# Patient Record
Sex: Female | Born: 1970 | Race: White | Hispanic: No | Marital: Married | Smoking: Never smoker
Health system: Southern US, Community
[De-identification: ages and names within clinical notes are randomized; demographics above are authoritative.]

## PROBLEM LIST (undated history)

## (undated) DIAGNOSIS — B019 Varicella without complication: Secondary | ICD-10-CM

## (undated) DIAGNOSIS — F32A Depression, unspecified: Secondary | ICD-10-CM

## (undated) DIAGNOSIS — J90 Pleural effusion, not elsewhere classified: Secondary | ICD-10-CM

## (undated) DIAGNOSIS — C50919 Malignant neoplasm of unspecified site of unspecified female breast: Secondary | ICD-10-CM

## (undated) DIAGNOSIS — G43909 Migraine, unspecified, not intractable, without status migrainosus: Secondary | ICD-10-CM

## (undated) DIAGNOSIS — E041 Nontoxic single thyroid nodule: Secondary | ICD-10-CM

## (undated) DIAGNOSIS — I509 Heart failure, unspecified: Secondary | ICD-10-CM

## (undated) DIAGNOSIS — F329 Major depressive disorder, single episode, unspecified: Secondary | ICD-10-CM

## (undated) HISTORY — DX: Varicella without complication: B01.9

## (undated) HISTORY — PX: WISDOM TOOTH EXTRACTION: SHX21

## (undated) HISTORY — DX: Malignant neoplasm of unspecified site of unspecified female breast: C50.919

## (undated) HISTORY — DX: Migraine, unspecified, not intractable, without status migrainosus: G43.909

## (undated) HISTORY — DX: Depression, unspecified: F32.A

## (undated) HISTORY — PX: TONSILLECTOMY AND ADENOIDECTOMY: SHX28

## (undated) HISTORY — PX: BUNIONECTOMY: SHX129

---
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
## 2015-10-22 DIAGNOSIS — I509 Heart failure, unspecified: Secondary | ICD-10-CM

## 2015-10-22 HISTORY — PX: BREAST BIOPSY: SHX20

## 2015-10-22 HISTORY — DX: Heart failure, unspecified: I50.9

## 2016-03-28 DIAGNOSIS — Z170 Estrogen receptor positive status [ER+]: Secondary | ICD-10-CM | POA: Insufficient documentation

## 2016-03-28 DIAGNOSIS — C50411 Malignant neoplasm of upper-outer quadrant of right female breast: Secondary | ICD-10-CM | POA: Insufficient documentation

## 2016-05-29 DIAGNOSIS — E041 Nontoxic single thyroid nodule: Secondary | ICD-10-CM | POA: Insufficient documentation

## 2016-05-29 HISTORY — DX: Nontoxic single thyroid nodule: E04.1

## 2016-07-02 DIAGNOSIS — T451X5A Adverse effect of antineoplastic and immunosuppressive drugs, initial encounter: Secondary | ICD-10-CM | POA: Insufficient documentation

## 2016-07-02 DIAGNOSIS — D701 Agranulocytosis secondary to cancer chemotherapy: Secondary | ICD-10-CM | POA: Insufficient documentation

## 2017-01-19 LAB — HM DEXA SCAN

## 2017-02-05 DIAGNOSIS — J90 Pleural effusion, not elsewhere classified: Secondary | ICD-10-CM

## 2017-02-05 DIAGNOSIS — I509 Heart failure, unspecified: Secondary | ICD-10-CM | POA: Insufficient documentation

## 2017-02-05 DIAGNOSIS — I5021 Acute systolic (congestive) heart failure: Secondary | ICD-10-CM | POA: Insufficient documentation

## 2017-02-05 HISTORY — DX: Pleural effusion, not elsewhere classified: J90

## 2017-06-24 DIAGNOSIS — Z170 Estrogen receptor positive status [ER+]: Secondary | ICD-10-CM | POA: Diagnosis not present

## 2017-06-24 DIAGNOSIS — C50411 Malignant neoplasm of upper-outer quadrant of right female breast: Secondary | ICD-10-CM | POA: Diagnosis not present

## 2017-06-24 DIAGNOSIS — Z5111 Encounter for antineoplastic chemotherapy: Secondary | ICD-10-CM | POA: Diagnosis not present

## 2017-07-07 DIAGNOSIS — M25511 Pain in right shoulder: Secondary | ICD-10-CM | POA: Diagnosis not present

## 2017-07-07 DIAGNOSIS — I509 Heart failure, unspecified: Secondary | ICD-10-CM | POA: Diagnosis not present

## 2017-07-07 DIAGNOSIS — Z6825 Body mass index (BMI) 25.0-25.9, adult: Secondary | ICD-10-CM | POA: Diagnosis not present

## 2017-07-07 DIAGNOSIS — I428 Other cardiomyopathies: Secondary | ICD-10-CM | POA: Diagnosis not present

## 2017-07-14 DIAGNOSIS — M7541 Impingement syndrome of right shoulder: Secondary | ICD-10-CM | POA: Diagnosis not present

## 2017-07-22 DIAGNOSIS — Z5112 Encounter for antineoplastic immunotherapy: Secondary | ICD-10-CM | POA: Diagnosis not present

## 2017-07-22 DIAGNOSIS — Z170 Estrogen receptor positive status [ER+]: Secondary | ICD-10-CM | POA: Diagnosis not present

## 2017-07-22 DIAGNOSIS — C50411 Malignant neoplasm of upper-outer quadrant of right female breast: Secondary | ICD-10-CM | POA: Diagnosis not present

## 2017-07-28 DIAGNOSIS — M75101 Unspecified rotator cuff tear or rupture of right shoulder, not specified as traumatic: Secondary | ICD-10-CM | POA: Diagnosis not present

## 2017-07-28 DIAGNOSIS — M7581 Other shoulder lesions, right shoulder: Secondary | ICD-10-CM | POA: Diagnosis not present

## 2017-07-28 DIAGNOSIS — M75111 Incomplete rotator cuff tear or rupture of right shoulder, not specified as traumatic: Secondary | ICD-10-CM | POA: Diagnosis not present

## 2017-07-30 DIAGNOSIS — S4381XA Sprain of other specified parts of right shoulder girdle, initial encounter: Secondary | ICD-10-CM | POA: Diagnosis not present

## 2017-07-30 DIAGNOSIS — Z01818 Encounter for other preprocedural examination: Secondary | ICD-10-CM | POA: Diagnosis not present

## 2017-08-04 DIAGNOSIS — C50911 Malignant neoplasm of unspecified site of right female breast: Secondary | ICD-10-CM | POA: Diagnosis not present

## 2017-08-04 DIAGNOSIS — Z923 Personal history of irradiation: Secondary | ICD-10-CM | POA: Diagnosis not present

## 2017-08-04 DIAGNOSIS — Z421 Encounter for breast reconstruction following mastectomy: Secondary | ICD-10-CM | POA: Diagnosis not present

## 2017-08-04 DIAGNOSIS — I5021 Acute systolic (congestive) heart failure: Secondary | ICD-10-CM | POA: Diagnosis not present

## 2017-08-04 DIAGNOSIS — Z9013 Acquired absence of bilateral breasts and nipples: Secondary | ICD-10-CM | POA: Diagnosis not present

## 2017-08-04 DIAGNOSIS — Z87891 Personal history of nicotine dependence: Secondary | ICD-10-CM | POA: Diagnosis not present

## 2017-08-05 DIAGNOSIS — I5021 Acute systolic (congestive) heart failure: Secondary | ICD-10-CM | POA: Diagnosis not present

## 2017-08-05 DIAGNOSIS — Z87891 Personal history of nicotine dependence: Secondary | ICD-10-CM | POA: Diagnosis not present

## 2017-08-05 DIAGNOSIS — Z923 Personal history of irradiation: Secondary | ICD-10-CM | POA: Diagnosis not present

## 2017-08-05 DIAGNOSIS — Z9013 Acquired absence of bilateral breasts and nipples: Secondary | ICD-10-CM | POA: Diagnosis not present

## 2017-08-05 DIAGNOSIS — Z421 Encounter for breast reconstruction following mastectomy: Secondary | ICD-10-CM | POA: Diagnosis not present

## 2017-08-05 DIAGNOSIS — C50911 Malignant neoplasm of unspecified site of right female breast: Secondary | ICD-10-CM | POA: Diagnosis not present

## 2017-08-19 DIAGNOSIS — Z5111 Encounter for antineoplastic chemotherapy: Secondary | ICD-10-CM | POA: Diagnosis not present

## 2017-08-19 DIAGNOSIS — Z170 Estrogen receptor positive status [ER+]: Secondary | ICD-10-CM | POA: Diagnosis not present

## 2017-08-19 DIAGNOSIS — C50411 Malignant neoplasm of upper-outer quadrant of right female breast: Secondary | ICD-10-CM | POA: Diagnosis not present

## 2017-08-19 DIAGNOSIS — Z6825 Body mass index (BMI) 25.0-25.9, adult: Secondary | ICD-10-CM | POA: Diagnosis not present

## 2017-08-23 DIAGNOSIS — Z87891 Personal history of nicotine dependence: Secondary | ICD-10-CM | POA: Diagnosis not present

## 2017-08-23 DIAGNOSIS — Z853 Personal history of malignant neoplasm of breast: Secondary | ICD-10-CM | POA: Diagnosis not present

## 2017-08-23 DIAGNOSIS — N61 Mastitis without abscess: Secondary | ICD-10-CM | POA: Diagnosis not present

## 2017-08-23 DIAGNOSIS — R918 Other nonspecific abnormal finding of lung field: Secondary | ICD-10-CM | POA: Diagnosis not present

## 2017-08-23 DIAGNOSIS — Z800 Family history of malignant neoplasm of digestive organs: Secondary | ICD-10-CM | POA: Diagnosis not present

## 2017-08-23 DIAGNOSIS — T8579XA Infection and inflammatory reaction due to other internal prosthetic devices, implants and grafts, initial encounter: Secondary | ICD-10-CM | POA: Diagnosis not present

## 2017-08-23 DIAGNOSIS — C50411 Malignant neoplasm of upper-outer quadrant of right female breast: Secondary | ICD-10-CM | POA: Diagnosis not present

## 2017-08-23 DIAGNOSIS — Z803 Family history of malignant neoplasm of breast: Secondary | ICD-10-CM | POA: Diagnosis not present

## 2017-08-23 DIAGNOSIS — R509 Fever, unspecified: Secondary | ICD-10-CM | POA: Diagnosis not present

## 2017-08-23 DIAGNOSIS — T451X5A Adverse effect of antineoplastic and immunosuppressive drugs, initial encounter: Secondary | ICD-10-CM | POA: Diagnosis not present

## 2017-08-23 DIAGNOSIS — T8140XA Infection following a procedure, unspecified, initial encounter: Secondary | ICD-10-CM | POA: Diagnosis not present

## 2017-08-23 DIAGNOSIS — I5021 Acute systolic (congestive) heart failure: Secondary | ICD-10-CM | POA: Diagnosis not present

## 2017-08-23 DIAGNOSIS — Z170 Estrogen receptor positive status [ER+]: Secondary | ICD-10-CM | POA: Diagnosis not present

## 2017-08-23 DIAGNOSIS — Z8041 Family history of malignant neoplasm of ovary: Secondary | ICD-10-CM | POA: Diagnosis not present

## 2017-09-18 DIAGNOSIS — C50411 Malignant neoplasm of upper-outer quadrant of right female breast: Secondary | ICD-10-CM | POA: Diagnosis not present

## 2017-09-18 DIAGNOSIS — Z170 Estrogen receptor positive status [ER+]: Secondary | ICD-10-CM | POA: Diagnosis not present

## 2017-09-18 DIAGNOSIS — Z5111 Encounter for antineoplastic chemotherapy: Secondary | ICD-10-CM | POA: Diagnosis not present

## 2017-10-20 DIAGNOSIS — Z170 Estrogen receptor positive status [ER+]: Secondary | ICD-10-CM | POA: Diagnosis not present

## 2017-10-20 DIAGNOSIS — C50411 Malignant neoplasm of upper-outer quadrant of right female breast: Secondary | ICD-10-CM | POA: Diagnosis not present

## 2017-10-21 HISTORY — PX: COLONOSCOPY: SHX174

## 2017-11-07 DIAGNOSIS — Z01818 Encounter for other preprocedural examination: Secondary | ICD-10-CM | POA: Diagnosis not present

## 2017-11-24 DIAGNOSIS — C50411 Malignant neoplasm of upper-outer quadrant of right female breast: Secondary | ICD-10-CM | POA: Diagnosis not present

## 2017-11-24 DIAGNOSIS — Z87891 Personal history of nicotine dependence: Secondary | ICD-10-CM | POA: Diagnosis not present

## 2017-11-24 DIAGNOSIS — I509 Heart failure, unspecified: Secondary | ICD-10-CM | POA: Diagnosis not present

## 2017-11-24 DIAGNOSIS — C50911 Malignant neoplasm of unspecified site of right female breast: Secondary | ICD-10-CM | POA: Diagnosis not present

## 2017-11-24 DIAGNOSIS — Z9011 Acquired absence of right breast and nipple: Secondary | ICD-10-CM | POA: Diagnosis not present

## 2017-11-26 DIAGNOSIS — I313 Pericardial effusion (noninflammatory): Secondary | ICD-10-CM | POA: Diagnosis not present

## 2017-11-26 DIAGNOSIS — Z79899 Other long term (current) drug therapy: Secondary | ICD-10-CM | POA: Diagnosis not present

## 2017-11-26 DIAGNOSIS — N644 Mastodynia: Secondary | ICD-10-CM | POA: Diagnosis not present

## 2017-11-26 DIAGNOSIS — Z6825 Body mass index (BMI) 25.0-25.9, adult: Secondary | ICD-10-CM | POA: Diagnosis not present

## 2017-11-26 DIAGNOSIS — C50411 Malignant neoplasm of upper-outer quadrant of right female breast: Secondary | ICD-10-CM | POA: Diagnosis not present

## 2017-11-26 DIAGNOSIS — Z170 Estrogen receptor positive status [ER+]: Secondary | ICD-10-CM | POA: Diagnosis not present

## 2017-11-26 DIAGNOSIS — F329 Major depressive disorder, single episode, unspecified: Secondary | ICD-10-CM | POA: Diagnosis not present

## 2017-12-03 DIAGNOSIS — Z170 Estrogen receptor positive status [ER+]: Secondary | ICD-10-CM | POA: Diagnosis not present

## 2017-12-03 DIAGNOSIS — Z6824 Body mass index (BMI) 24.0-24.9, adult: Secondary | ICD-10-CM | POA: Diagnosis not present

## 2017-12-03 DIAGNOSIS — C50411 Malignant neoplasm of upper-outer quadrant of right female breast: Secondary | ICD-10-CM | POA: Diagnosis not present

## 2017-12-10 DIAGNOSIS — S4381XD Sprain of other specified parts of right shoulder girdle, subsequent encounter: Secondary | ICD-10-CM | POA: Diagnosis not present

## 2017-12-24 DIAGNOSIS — Z170 Estrogen receptor positive status [ER+]: Secondary | ICD-10-CM | POA: Diagnosis not present

## 2017-12-24 DIAGNOSIS — Z5112 Encounter for antineoplastic immunotherapy: Secondary | ICD-10-CM | POA: Diagnosis not present

## 2017-12-24 DIAGNOSIS — C50411 Malignant neoplasm of upper-outer quadrant of right female breast: Secondary | ICD-10-CM | POA: Diagnosis not present

## 2018-01-02 DIAGNOSIS — I5022 Chronic systolic (congestive) heart failure: Secondary | ICD-10-CM | POA: Diagnosis not present

## 2018-01-02 DIAGNOSIS — Z6825 Body mass index (BMI) 25.0-25.9, adult: Secondary | ICD-10-CM | POA: Diagnosis not present

## 2018-01-02 DIAGNOSIS — I428 Other cardiomyopathies: Secondary | ICD-10-CM | POA: Diagnosis not present

## 2018-01-02 DIAGNOSIS — I509 Heart failure, unspecified: Secondary | ICD-10-CM | POA: Diagnosis not present

## 2018-01-09 DIAGNOSIS — I5022 Chronic systolic (congestive) heart failure: Secondary | ICD-10-CM | POA: Diagnosis not present

## 2018-01-09 DIAGNOSIS — I428 Other cardiomyopathies: Secondary | ICD-10-CM | POA: Diagnosis not present

## 2018-01-21 DIAGNOSIS — Z5112 Encounter for antineoplastic immunotherapy: Secondary | ICD-10-CM | POA: Diagnosis not present

## 2018-01-21 DIAGNOSIS — C50411 Malignant neoplasm of upper-outer quadrant of right female breast: Secondary | ICD-10-CM | POA: Diagnosis not present

## 2018-01-21 DIAGNOSIS — R1111 Vomiting without nausea: Secondary | ICD-10-CM | POA: Diagnosis not present

## 2018-01-21 DIAGNOSIS — Z170 Estrogen receptor positive status [ER+]: Secondary | ICD-10-CM | POA: Diagnosis not present

## 2018-01-21 DIAGNOSIS — R110 Nausea: Secondary | ICD-10-CM | POA: Diagnosis not present

## 2018-01-21 DIAGNOSIS — R51 Headache: Secondary | ICD-10-CM | POA: Diagnosis not present

## 2018-01-21 DIAGNOSIS — G44201 Tension-type headache, unspecified, intractable: Secondary | ICD-10-CM | POA: Diagnosis not present

## 2018-01-26 DIAGNOSIS — Z23 Encounter for immunization: Secondary | ICD-10-CM | POA: Diagnosis not present

## 2018-01-26 DIAGNOSIS — Z131 Encounter for screening for diabetes mellitus: Secondary | ICD-10-CM | POA: Diagnosis not present

## 2018-01-26 DIAGNOSIS — Z130 Encounter for screening for diseases of the blood and blood-forming organs and certain disorders involving the immune mechanism: Secondary | ICD-10-CM | POA: Diagnosis not present

## 2018-01-26 DIAGNOSIS — G43009 Migraine without aura, not intractable, without status migrainosus: Secondary | ICD-10-CM | POA: Diagnosis not present

## 2018-01-26 DIAGNOSIS — Z0000 Encounter for general adult medical examination without abnormal findings: Secondary | ICD-10-CM | POA: Diagnosis not present

## 2018-01-26 DIAGNOSIS — I509 Heart failure, unspecified: Secondary | ICD-10-CM | POA: Diagnosis not present

## 2018-01-26 DIAGNOSIS — Z13220 Encounter for screening for lipoid disorders: Secondary | ICD-10-CM | POA: Diagnosis not present

## 2018-01-26 DIAGNOSIS — Z1329 Encounter for screening for other suspected endocrine disorder: Secondary | ICD-10-CM | POA: Diagnosis not present

## 2018-02-09 DIAGNOSIS — I5022 Chronic systolic (congestive) heart failure: Secondary | ICD-10-CM | POA: Diagnosis not present

## 2018-02-09 DIAGNOSIS — I509 Heart failure, unspecified: Secondary | ICD-10-CM | POA: Diagnosis not present

## 2018-02-09 DIAGNOSIS — I428 Other cardiomyopathies: Secondary | ICD-10-CM | POA: Diagnosis not present

## 2018-02-09 DIAGNOSIS — Z6825 Body mass index (BMI) 25.0-25.9, adult: Secondary | ICD-10-CM | POA: Diagnosis not present

## 2018-02-20 DIAGNOSIS — Z170 Estrogen receptor positive status [ER+]: Secondary | ICD-10-CM | POA: Diagnosis not present

## 2018-02-20 DIAGNOSIS — C50411 Malignant neoplasm of upper-outer quadrant of right female breast: Secondary | ICD-10-CM | POA: Diagnosis not present

## 2018-03-27 DIAGNOSIS — I427 Cardiomyopathy due to drug and external agent: Secondary | ICD-10-CM | POA: Diagnosis not present

## 2018-03-27 DIAGNOSIS — Z170 Estrogen receptor positive status [ER+]: Secondary | ICD-10-CM | POA: Diagnosis not present

## 2018-03-27 DIAGNOSIS — Z5111 Encounter for antineoplastic chemotherapy: Secondary | ICD-10-CM | POA: Diagnosis not present

## 2018-03-27 DIAGNOSIS — C50411 Malignant neoplasm of upper-outer quadrant of right female breast: Secondary | ICD-10-CM | POA: Diagnosis not present

## 2018-03-27 DIAGNOSIS — E2839 Other primary ovarian failure: Secondary | ICD-10-CM | POA: Diagnosis not present

## 2018-04-27 DIAGNOSIS — Z9013 Acquired absence of bilateral breasts and nipples: Secondary | ICD-10-CM | POA: Diagnosis not present

## 2018-04-27 DIAGNOSIS — I5021 Acute systolic (congestive) heart failure: Secondary | ICD-10-CM | POA: Diagnosis not present

## 2018-04-27 DIAGNOSIS — R51 Headache: Secondary | ICD-10-CM | POA: Diagnosis not present

## 2018-04-27 DIAGNOSIS — Z9221 Personal history of antineoplastic chemotherapy: Secondary | ICD-10-CM | POA: Diagnosis not present

## 2018-04-27 DIAGNOSIS — Z421 Encounter for breast reconstruction following mastectomy: Secondary | ICD-10-CM | POA: Diagnosis not present

## 2018-04-27 DIAGNOSIS — I5022 Chronic systolic (congestive) heart failure: Secondary | ICD-10-CM | POA: Diagnosis not present

## 2018-04-27 DIAGNOSIS — C50411 Malignant neoplasm of upper-outer quadrant of right female breast: Secondary | ICD-10-CM | POA: Diagnosis not present

## 2018-04-27 DIAGNOSIS — Z923 Personal history of irradiation: Secondary | ICD-10-CM | POA: Diagnosis not present

## 2018-04-27 DIAGNOSIS — I428 Other cardiomyopathies: Secondary | ICD-10-CM | POA: Diagnosis not present

## 2018-04-27 DIAGNOSIS — Z87891 Personal history of nicotine dependence: Secondary | ICD-10-CM | POA: Diagnosis not present

## 2018-05-27 DIAGNOSIS — C50411 Malignant neoplasm of upper-outer quadrant of right female breast: Secondary | ICD-10-CM | POA: Diagnosis not present

## 2018-05-27 DIAGNOSIS — E782 Mixed hyperlipidemia: Secondary | ICD-10-CM | POA: Diagnosis not present

## 2018-05-27 DIAGNOSIS — Z6826 Body mass index (BMI) 26.0-26.9, adult: Secondary | ICD-10-CM | POA: Diagnosis not present

## 2018-05-27 DIAGNOSIS — K921 Melena: Secondary | ICD-10-CM | POA: Diagnosis not present

## 2018-05-27 DIAGNOSIS — R7309 Other abnormal glucose: Secondary | ICD-10-CM | POA: Diagnosis not present

## 2018-05-27 DIAGNOSIS — Z170 Estrogen receptor positive status [ER+]: Secondary | ICD-10-CM | POA: Diagnosis not present

## 2018-06-04 DIAGNOSIS — K921 Melena: Secondary | ICD-10-CM | POA: Diagnosis not present

## 2018-06-04 DIAGNOSIS — C50411 Malignant neoplasm of upper-outer quadrant of right female breast: Secondary | ICD-10-CM | POA: Diagnosis not present

## 2018-06-04 DIAGNOSIS — R194 Change in bowel habit: Secondary | ICD-10-CM | POA: Diagnosis not present

## 2018-06-04 DIAGNOSIS — R197 Diarrhea, unspecified: Secondary | ICD-10-CM | POA: Diagnosis not present

## 2018-06-05 DIAGNOSIS — K921 Melena: Secondary | ICD-10-CM | POA: Diagnosis not present

## 2018-06-09 DIAGNOSIS — K625 Hemorrhage of anus and rectum: Secondary | ICD-10-CM | POA: Diagnosis not present

## 2018-06-09 DIAGNOSIS — K648 Other hemorrhoids: Secondary | ICD-10-CM | POA: Diagnosis not present

## 2018-06-26 DIAGNOSIS — Z170 Estrogen receptor positive status [ER+]: Secondary | ICD-10-CM | POA: Diagnosis not present

## 2018-06-26 DIAGNOSIS — C50411 Malignant neoplasm of upper-outer quadrant of right female breast: Secondary | ICD-10-CM | POA: Diagnosis not present

## 2018-07-28 DIAGNOSIS — C50411 Malignant neoplasm of upper-outer quadrant of right female breast: Secondary | ICD-10-CM | POA: Diagnosis not present

## 2018-07-28 DIAGNOSIS — G5691 Unspecified mononeuropathy of right upper limb: Secondary | ICD-10-CM | POA: Diagnosis not present

## 2018-07-28 DIAGNOSIS — E2839 Other primary ovarian failure: Secondary | ICD-10-CM | POA: Diagnosis not present

## 2018-07-28 DIAGNOSIS — I427 Cardiomyopathy due to drug and external agent: Secondary | ICD-10-CM | POA: Diagnosis not present

## 2018-07-28 DIAGNOSIS — Z170 Estrogen receptor positive status [ER+]: Secondary | ICD-10-CM | POA: Diagnosis not present

## 2018-08-11 DIAGNOSIS — I509 Heart failure, unspecified: Secondary | ICD-10-CM | POA: Diagnosis not present

## 2018-08-11 DIAGNOSIS — Z6826 Body mass index (BMI) 26.0-26.9, adult: Secondary | ICD-10-CM | POA: Diagnosis not present

## 2018-08-11 DIAGNOSIS — I428 Other cardiomyopathies: Secondary | ICD-10-CM | POA: Diagnosis not present

## 2018-08-11 DIAGNOSIS — I5022 Chronic systolic (congestive) heart failure: Secondary | ICD-10-CM | POA: Diagnosis not present

## 2018-08-17 DIAGNOSIS — Z01818 Encounter for other preprocedural examination: Secondary | ICD-10-CM | POA: Diagnosis not present

## 2018-08-17 DIAGNOSIS — G5691 Unspecified mononeuropathy of right upper limb: Secondary | ICD-10-CM | POA: Diagnosis not present

## 2018-08-17 DIAGNOSIS — G5603 Carpal tunnel syndrome, bilateral upper limbs: Secondary | ICD-10-CM | POA: Diagnosis not present

## 2018-08-24 DIAGNOSIS — Z9013 Acquired absence of bilateral breasts and nipples: Secondary | ICD-10-CM | POA: Diagnosis not present

## 2018-08-24 DIAGNOSIS — N650 Deformity of reconstructed breast: Secondary | ICD-10-CM | POA: Diagnosis not present

## 2018-08-24 DIAGNOSIS — Z421 Encounter for breast reconstruction following mastectomy: Secondary | ICD-10-CM | POA: Diagnosis not present

## 2018-08-24 DIAGNOSIS — N6481 Ptosis of breast: Secondary | ICD-10-CM | POA: Diagnosis not present

## 2018-08-24 DIAGNOSIS — T66XXXA Radiation sickness, unspecified, initial encounter: Secondary | ICD-10-CM | POA: Diagnosis not present

## 2018-08-24 DIAGNOSIS — N6489 Other specified disorders of breast: Secondary | ICD-10-CM | POA: Diagnosis not present

## 2018-08-24 DIAGNOSIS — C50411 Malignant neoplasm of upper-outer quadrant of right female breast: Secondary | ICD-10-CM | POA: Diagnosis not present

## 2018-08-24 DIAGNOSIS — Z9221 Personal history of antineoplastic chemotherapy: Secondary | ICD-10-CM | POA: Diagnosis not present

## 2018-08-24 DIAGNOSIS — R079 Chest pain, unspecified: Secondary | ICD-10-CM | POA: Diagnosis not present

## 2018-08-24 DIAGNOSIS — Z923 Personal history of irradiation: Secondary | ICD-10-CM | POA: Diagnosis not present

## 2018-08-24 DIAGNOSIS — Z87891 Personal history of nicotine dependence: Secondary | ICD-10-CM | POA: Diagnosis not present

## 2018-08-24 DIAGNOSIS — Z853 Personal history of malignant neoplasm of breast: Secondary | ICD-10-CM | POA: Diagnosis not present

## 2018-08-28 DIAGNOSIS — Z170 Estrogen receptor positive status [ER+]: Secondary | ICD-10-CM | POA: Diagnosis not present

## 2018-08-28 DIAGNOSIS — Z5111 Encounter for antineoplastic chemotherapy: Secondary | ICD-10-CM | POA: Diagnosis not present

## 2018-08-28 DIAGNOSIS — C50411 Malignant neoplasm of upper-outer quadrant of right female breast: Secondary | ICD-10-CM | POA: Diagnosis not present

## 2018-09-28 DIAGNOSIS — Z170 Estrogen receptor positive status [ER+]: Secondary | ICD-10-CM | POA: Diagnosis not present

## 2018-09-28 DIAGNOSIS — C50411 Malignant neoplasm of upper-outer quadrant of right female breast: Secondary | ICD-10-CM | POA: Diagnosis not present

## 2018-10-07 DIAGNOSIS — G5603 Carpal tunnel syndrome, bilateral upper limbs: Secondary | ICD-10-CM | POA: Diagnosis not present

## 2018-10-07 DIAGNOSIS — G5613 Other lesions of median nerve, bilateral upper limbs: Secondary | ICD-10-CM | POA: Diagnosis not present

## 2018-10-07 DIAGNOSIS — R200 Anesthesia of skin: Secondary | ICD-10-CM | POA: Diagnosis not present

## 2018-10-22 DIAGNOSIS — G5603 Carpal tunnel syndrome, bilateral upper limbs: Secondary | ICD-10-CM | POA: Diagnosis not present

## 2018-10-28 DIAGNOSIS — Z170 Estrogen receptor positive status [ER+]: Secondary | ICD-10-CM | POA: Diagnosis not present

## 2018-10-28 DIAGNOSIS — Z5111 Encounter for antineoplastic chemotherapy: Secondary | ICD-10-CM | POA: Diagnosis not present

## 2018-10-28 DIAGNOSIS — C50411 Malignant neoplasm of upper-outer quadrant of right female breast: Secondary | ICD-10-CM | POA: Diagnosis not present

## 2018-11-05 DIAGNOSIS — R05 Cough: Secondary | ICD-10-CM | POA: Diagnosis not present

## 2018-11-05 DIAGNOSIS — Z6825 Body mass index (BMI) 25.0-25.9, adult: Secondary | ICD-10-CM | POA: Diagnosis not present

## 2018-11-05 DIAGNOSIS — J069 Acute upper respiratory infection, unspecified: Secondary | ICD-10-CM | POA: Diagnosis not present

## 2018-12-01 DIAGNOSIS — C50411 Malignant neoplasm of upper-outer quadrant of right female breast: Secondary | ICD-10-CM | POA: Diagnosis not present

## 2018-12-01 DIAGNOSIS — Z5111 Encounter for antineoplastic chemotherapy: Secondary | ICD-10-CM | POA: Diagnosis not present

## 2018-12-01 DIAGNOSIS — Z170 Estrogen receptor positive status [ER+]: Secondary | ICD-10-CM | POA: Diagnosis not present

## 2018-12-01 DIAGNOSIS — Z5181 Encounter for therapeutic drug level monitoring: Secondary | ICD-10-CM | POA: Diagnosis not present

## 2018-12-01 DIAGNOSIS — Z9189 Other specified personal risk factors, not elsewhere classified: Secondary | ICD-10-CM | POA: Diagnosis not present

## 2018-12-11 DIAGNOSIS — N390 Urinary tract infection, site not specified: Secondary | ICD-10-CM | POA: Diagnosis not present

## 2018-12-11 DIAGNOSIS — Z6826 Body mass index (BMI) 26.0-26.9, adult: Secondary | ICD-10-CM | POA: Diagnosis not present

## 2018-12-29 DIAGNOSIS — Z5111 Encounter for antineoplastic chemotherapy: Secondary | ICD-10-CM | POA: Diagnosis not present

## 2018-12-29 DIAGNOSIS — C50411 Malignant neoplasm of upper-outer quadrant of right female breast: Secondary | ICD-10-CM | POA: Diagnosis not present

## 2018-12-29 DIAGNOSIS — Z170 Estrogen receptor positive status [ER+]: Secondary | ICD-10-CM | POA: Diagnosis not present

## 2019-01-25 DIAGNOSIS — I5022 Chronic systolic (congestive) heart failure: Secondary | ICD-10-CM | POA: Diagnosis not present

## 2019-01-25 DIAGNOSIS — I428 Other cardiomyopathies: Secondary | ICD-10-CM | POA: Diagnosis not present

## 2019-01-26 DIAGNOSIS — Z5111 Encounter for antineoplastic chemotherapy: Secondary | ICD-10-CM | POA: Diagnosis not present

## 2019-01-26 DIAGNOSIS — Z170 Estrogen receptor positive status [ER+]: Secondary | ICD-10-CM | POA: Diagnosis not present

## 2019-01-26 DIAGNOSIS — C50411 Malignant neoplasm of upper-outer quadrant of right female breast: Secondary | ICD-10-CM | POA: Diagnosis not present

## 2019-02-23 DIAGNOSIS — Z170 Estrogen receptor positive status [ER+]: Secondary | ICD-10-CM | POA: Diagnosis not present

## 2019-02-23 DIAGNOSIS — Z5111 Encounter for antineoplastic chemotherapy: Secondary | ICD-10-CM | POA: Diagnosis not present

## 2019-02-23 DIAGNOSIS — C50411 Malignant neoplasm of upper-outer quadrant of right female breast: Secondary | ICD-10-CM | POA: Diagnosis not present

## 2019-03-24 DIAGNOSIS — Z9221 Personal history of antineoplastic chemotherapy: Secondary | ICD-10-CM | POA: Diagnosis not present

## 2019-03-24 DIAGNOSIS — Z923 Personal history of irradiation: Secondary | ICD-10-CM | POA: Diagnosis not present

## 2019-03-24 DIAGNOSIS — C50411 Malignant neoplasm of upper-outer quadrant of right female breast: Secondary | ICD-10-CM | POA: Diagnosis not present

## 2019-03-24 DIAGNOSIS — Z170 Estrogen receptor positive status [ER+]: Secondary | ICD-10-CM | POA: Diagnosis not present

## 2019-03-24 DIAGNOSIS — L0390 Cellulitis, unspecified: Secondary | ICD-10-CM | POA: Diagnosis not present

## 2019-03-24 DIAGNOSIS — I340 Nonrheumatic mitral (valve) insufficiency: Secondary | ICD-10-CM | POA: Diagnosis not present

## 2019-03-24 DIAGNOSIS — I509 Heart failure, unspecified: Secondary | ICD-10-CM | POA: Diagnosis not present

## 2019-03-24 DIAGNOSIS — Z9012 Acquired absence of left breast and nipple: Secondary | ICD-10-CM | POA: Diagnosis not present

## 2019-03-24 DIAGNOSIS — I313 Pericardial effusion (noninflammatory): Secondary | ICD-10-CM | POA: Diagnosis not present

## 2019-03-24 DIAGNOSIS — F329 Major depressive disorder, single episode, unspecified: Secondary | ICD-10-CM | POA: Diagnosis not present

## 2019-03-24 DIAGNOSIS — Z6825 Body mass index (BMI) 25.0-25.9, adult: Secondary | ICD-10-CM | POA: Diagnosis not present

## 2019-03-24 DIAGNOSIS — Z9289 Personal history of other medical treatment: Secondary | ICD-10-CM | POA: Diagnosis not present

## 2019-04-21 HISTORY — PX: MASTECTOMY: SHX3

## 2019-04-26 DIAGNOSIS — Z170 Estrogen receptor positive status [ER+]: Secondary | ICD-10-CM | POA: Diagnosis not present

## 2019-04-26 DIAGNOSIS — C50411 Malignant neoplasm of upper-outer quadrant of right female breast: Secondary | ICD-10-CM | POA: Diagnosis not present

## 2019-05-25 ENCOUNTER — Encounter: Payer: Self-pay | Admitting: Family Medicine

## 2019-05-25 ENCOUNTER — Other Ambulatory Visit: Payer: Self-pay

## 2019-05-25 ENCOUNTER — Ambulatory Visit (INDEPENDENT_AMBULATORY_CARE_PROVIDER_SITE_OTHER): Payer: BC Managed Care – PPO | Admitting: Family Medicine

## 2019-05-25 VITALS — BP 110/80 | HR 80 | Temp 97.7°F | Resp 17 | Ht 61.0 in | Wt 131.4 lb

## 2019-05-25 DIAGNOSIS — C50411 Malignant neoplasm of upper-outer quadrant of right female breast: Secondary | ICD-10-CM | POA: Diagnosis not present

## 2019-05-25 DIAGNOSIS — F339 Major depressive disorder, recurrent, unspecified: Secondary | ICD-10-CM | POA: Insufficient documentation

## 2019-05-25 DIAGNOSIS — Z7689 Persons encountering health services in other specified circumstances: Secondary | ICD-10-CM

## 2019-05-25 DIAGNOSIS — F4321 Adjustment disorder with depressed mood: Secondary | ICD-10-CM | POA: Insufficient documentation

## 2019-05-25 DIAGNOSIS — I5022 Chronic systolic (congestive) heart failure: Secondary | ICD-10-CM

## 2019-05-25 DIAGNOSIS — Z170 Estrogen receptor positive status [ER+]: Secondary | ICD-10-CM

## 2019-05-25 DIAGNOSIS — F330 Major depressive disorder, recurrent, mild: Secondary | ICD-10-CM | POA: Insufficient documentation

## 2019-05-25 DIAGNOSIS — I10 Essential (primary) hypertension: Secondary | ICD-10-CM | POA: Insufficient documentation

## 2019-05-25 MED ORDER — BUPROPION HCL ER (XL) 300 MG PO TB24: 300.0000 mg | ORAL_TABLET | Freq: Every day | ORAL | 1 refills | Status: DC

## 2019-05-25 MED ORDER — TRAZODONE HCL 50 MG PO TABS: ORAL_TABLET | ORAL | 0 refills | Status: DC

## 2019-05-25 NOTE — Patient Instructions (Addendum)
Pleasure to meet you today. If you guys need anything, please let us know. Start the trazodone before bed as we discussed Our office will call you to schedule a follow up in 4 weeks.     Please help [place] help you:  We are honored you have chosen [facility] for your Primary Care home. Below you will find basic instructions that you may need to access in the future. Please help [place] help you by reading the instructions, which cover many of the frequent questions we experience.   Prescription refills and request:  -In order to allow more efficient response time, please call your pharmacy for all refills. They will forward the request electronically to [place]. This allows for the quickest possible response. Request left on a nurse line can take longer to refill, since these are checked as time allows between office patients and other phone calls.  - refill request can take up to 3-5 working days to complete.  - If request is sent electronically and request is appropiate, it is usually completed in 1-2 business days.  - all patients will need to be seen routinely for all chronic medical conditions requiring prescription medications (see follow-up below). If you are overdue for follow up on your condition, you will be asked to make an appointment and we will call in enough medication to cover you until your appointment (up to 30 days).  - all controlled substances will require a face to face visit to request/refill.  - if you desire your prescriptions to go through a new pharmacy, and have an active script at original pharmacy, you will need to call your pharmacy and have scripts transferred to new pharmacy. This is completed between the pharmacy locations and not by your provider.    Results: If any images or labs were ordered, it can take up to 1 week to get results depending on the test ordered and the lab/facility running and resulting the test. - Normal or stable results, which do not need further  discussion, may be released to your mychart immediately with attached note to you. A call may not be generated for normal results. Please make certain to sign up for mychart. If you have questions on how to activate your mychart you can call the front office.  - If your results need further discussion, our office will attempt to contact you via phone, and if unable to reach you after 2 attempts, we will release your abnormal result to your mychart with instructions.  - All results will be automatically released in mychart after 1 week.  - Your provider will provide you with explanation and instruction on all relevant material in your results. Please keep in mind, results and labs may appear confusing or abnormal to the untrained eye, but it does not mean they are actually abnormal for you personally. If you have any questions about your results that are not covered, or you desire more detailed explanation than what was provided, you should make an appointment with your provider to do so.   Our office handles many outgoing and incoming calls daily. If we have not contacted you within 1 week about your results, please check your mychart to see if there is a message first and if not, then contact our office.  In helping with this matter, you help decrease call volume, and therefore allow [place] to be able to respond to patients needs more efficiently.   Acute office visits (sick visit):  An acute  visit is intended for a new problem and are scheduled in shorter time slots to allow schedule openings for patients with new problems. This is the appropriate visit to discuss a new problem. Problems will not be addressed by phone call or Echart message. Appointment is needed if requesting treatment. In order to provide you with excellent quality medical care with proper time for you to explain your problem, have an exam and receive treatment with instructions, these appointments should be limited to one new problem per  visit. If you experience a new problem, in which you desire to be addressed, please make an acute office visit, we save openings on the schedule to accommodate you. Please do not save your new problem for any other type of visit, let us take care of it properly and quickly for you.   Follow up visits:  Depending on your condition(s) your provider will need to see you routinely in order to provide you with quality care and prescribe medication(s). Most chronic conditions (Example: hypertension, Diabetes, depression/anxiety... etc), require visits a couple times a year. Your provider will instruct you on proper follow up for your personal medical conditions and history. Please make certain to make follow up appointments for your condition as instructed. Failing to do so could result in lapse in your medication treatment/refills. If you request a refill, and are overdue to be seen on a condition, we will always provide you with a 30 day script (once) to allow you time to schedule.    Medicare wellness (well visit): - we have a wonderful Nurse Maudie Mercury), that will meet with you and provide you will yearly medicare wellness visits. These visits should occur yearly (can not be scheduled less than 1 calendar year apart) and cover preventive health, immunizations, advance directives and screenings you are entitled to yearly through your medicare benefits. Do not miss out on your entitled benefits, this is when medicare will pay for these benefits to be ordered for you.  These are strongly encouraged by your provider and is the appropriate type of visit to make certain you are up to date with all preventive health benefits. If you have not had your medicare wellness exam in the last 12 months, please make certain to schedule one by calling the office and schedule your medicare wellness with Maudie Mercury as soon as possible.   Yearly physical (well visit):  - Adults are recommended to be seen yearly for physicals. Check with  your insurance and date of your last physical, most insurances require one calendar year between physicals. Physicals include all preventive health topics, screenings, medical exam and labs that are appropriate for gender/age and history. You may have fasting labs needed at this visit. This is a well visit (not a sick visit), new problems should not be covered during this visit (see acute visit).  - Pediatric patients are seen more frequently when they are younger. Your provider will advise you on well child visit timing that is appropriate for your their age. - This is not a medicare wellness visit. Medicare wellness exams do not have an exam portion to the visit. Some medicare companies allow for a physical, some do not allow a yearly physical. If your medicare allows a yearly physical you can schedule the medicare wellness with our nurse Maudie Mercury and have your physical with your provider after, on the same day. Please check with insurance for your full benefits.   Late Policy/No Shows:  - all new patients should arrive 15-30 minutes  earlier than appointment to allow [place] time  to  obtain all personal demographics,  insurance information and for you to complete office paperwork. - All established patients should arrive 10-15 minutes earlier than appointment time to update all information and be checked in .  - In our best efforts to run on time, if you are late for your appointment you will be asked to either reschedule or if able, we will work you back into the schedule. There will be a wait time to work you back in the schedule,  depending on availability.  - If you are unable to make it to your appointment as scheduled, please call 24 hours ahead of time to allow [place] to fill the time slot with someone else who needs to be seen. If you do not cancel your appointment ahead of time, you may be charged a no show fee.

## 2019-05-25 NOTE — Progress Notes (Signed)
Patient ID: Carmen Shannon, female  DOB: [birth year], 48 y.o.   MRN: [id number] Patient Care Team    Relationship Specialty Notifications Start End  Ma Hillock, DO PCP - General Family Medicine  05/25/19   Mollie Germany, MD Referring Physician Hematology and Oncology  05/25/19   Johnston Ebbs, [place]  Physician Assistant  05/25/19   Draeger, Stevphen Rochester, MD Referring Physician Orthopedic Surgery  05/25/19     Chief Complaint  Patient presents with  . [facility] prior PCP. Pt lost son two weeks ago. colonoscopy 2019 @ [facility].    Subjective:  Carmen Shannon is a 48 y.o.  female present for new patient establishment. All past medical history, surgical history, allergies, family history, immunizations, medications and social history were updated in the electronic medical record today. All recent labs, ED visits and hospitalizations within the last year were reviewed.  Patient presents today to establish care.  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  She is under care of oncology at [facility].  She is prescribed oxybutynin, Lupron and Femara through her oncologist.  She has a history of heart failure with onset of 2017.  She reports this occurred secondary to her chemotherapy medications.  Her blood pressure and heart has been stable with the current regimen of valsartan 160 mg daily, Coreg 25 mg twice daily and Lasix 20 mg daily. Patient denies chest pain, shortness of breath, dizziness or lower extremity edema.  She follows with cardiology at [facility].  Patient has a history of depression, that started in 2017.  She reports when she was diagnosed with breast cancer she was started on Wellbutrin to help with depression.  She reports her dose of 300 mg daily has been stable overall until recently.  2 weeks ago, 1 of her twin sons had passed away.  His twin currently still lives at home with her,  but is ready to start college.  she also has a daughter that lives in [place].  She reports her family has started counseling together and are getting great benefit from the sessions.  She does endorse increased depression and anxiety today.  She states she does not sleep well at all.  Depression screen PHQ 2/9 05/25/2019  Decreased Interest 3  Down, Depressed, Hopeless 3  PHQ - 2 Score 6  Altered sleeping 3  Tired, decreased energy 3  Change in appetite 3  Feeling bad or failure about yourself  3  Trouble concentrating 3  Moving slowly or fidgety/restless 3  Suicidal thoughts 0  PHQ-9 Score 24  Difficult doing work/chores Very difficult   GAD 7 : Generalized Anxiety Score 05/25/2019  Nervous, Anxious, on Edge 3  Control/stop worrying 3  Worry too much - different things 3  Trouble relaxing 3  Restless 3  Easily annoyed or irritable 3  Afraid - awful might happen 3  Total GAD 7 Score 21  Anxiety Difficulty Very difficult       No flowsheet data found.  Immunization History  Administered Date(s) Administered  . Hepatitis A 04/28/2017, 01/26/2018  . Influenza,inj,Quad PF,6+ Mos 08/05/2017  . Tdap 04/28/2017    No exam data present  Past Medical History:  Diagnosis Date  . Breast cancer ([place])   . CHF (congestive heart failure) ([place]) 2017   From Chemo  . Chicken pox   . Depression   .  Migraines   . Pleural effusion 02/05/2017  . Right thyroid nodule 05/29/2016   Allergies  Allergen Reactions  . Diphenhydramine Hcl Other (See Comments)    Paradoxical reaction.  "Hyper"   Past Surgical History:  Procedure Laterality Date  . BREAST BIOPSY  2017  . BUNIONECTOMY    . [place], 2001  . COLONOSCOPY  2019  . MASTECTOMY Bilateral 04/2019  . TONSILLECTOMY AND ADENOIDECTOMY    . WISDOM TOOTH EXTRACTION     Family History  Problem Relation Age of Onset  . Lung cancer Mother   . Alzheimer's disease Maternal Grandmother   . Heart attack Maternal  Grandfather    Social History   Social History Narrative   Marital status/children/pets: married. 2 living children (1 son (twin) deceased)   Education/employment: B.S. Engineer, site.        Allergies as of 05/25/2019      Reactions   Diphenhydramine Hcl Other (See Comments)   Paradoxical reaction.  "Hyper"      Medication List       Accurate as of May 25, 2019  1:37 PM. If you have any questions, ask your nurse or doctor.        buPROPion 300 MG 24 hr tablet Commonly known as: Wellbutrin XL Take 1 tablet (300 mg total) by mouth daily. What changed:   how much to take  when to take this Changed by: Howard Pouch, DO   carvedilol 25 MG tablet Commonly known as: COREG Take by mouth 2 (two) times daily.   furosemide 20 MG tablet Commonly known as: LASIX Take by mouth.   letrozole 2.5 MG tablet Commonly known as: FEMARA Take by mouth.   leuprolide 11.25 MG Kit injection Commonly known as: LUPRON Inject into the muscle.   oxybutynin 5 MG tablet Commonly known as: DITROPAN Take 5 mg by mouth 2 (two) times daily.   SUMAtriptan 100 MG tablet Commonly known as: IMITREX Take by mouth.   traZODone 50 MG tablet Commonly known as: DESYREL Start 1/2 tab QHS, may increase every 3 days if needed until max dose of 100 mg QHS. Started by: Howard Pouch, DO   valACYclovir 1000 MG tablet Commonly known as: VALTREX   valsartan 160 MG tablet Commonly known as: DIOVAN       All past medical history, surgical history, allergies, family history, immunizations andmedications were updated in the EMR today and reviewed under the history and medication portions of their EMR.    No results found for this or any previous visit (from the past 2160 hour(s)).  Patient was never admitted.   ROS: 14 pt review of systems performed and negative (unless mentioned in an HPI)  Objective: BP 110/80 (BP Location: Left Arm, Patient Position: Sitting, Cuff Size: Normal)   Pulse  80   Temp 97.7 F (36.5 C) (Temporal)   Resp 17   Ht '5\' 1"'  (1.549 m)   Wt 131 lb 6 oz (59.6 kg)   SpO2 97%   BMI 24.82 kg/m  Gen: Afebrile. No acute distress. Nontoxic in appearance, well-developed, well-nourished,  Pleasant caucasian female.  HENT: AT. [place]. MMM.  No cough or shortness of breath present on exam today. Eyes:Pupils Equal Round Reactive to light, Extraocular movements intact,  Conjunctiva without redness, discharge or icterus. Neck/lymp/endocrine: Supple, no lymphadenopathy CV: RRR no murmur, no edema, +2/4 P posterior tibialis pulses.  No carotid bruits. No JVD. Chest: CTAB, no wheeze, rhonchi or crackles.  Normal respiratory effort.  Good air movement. Abd: Soft.  BS present.  Skin: Warm and well-perfused. Skin intact. Neuro/Msk: Normal gait. PERLA. EOMi. Alert. Oriented x3.  Psych: Tearful at times, normal affect, dress and demeanor. Normal speech. Normal thought content and judgment.  Normal grief reaction.  No SI or HI.   Assessment/plan: Carmen Shannon is a 48 y.o. female present for Est care Essential hypertension/heart failure Current regimen valsartan 160 mg daily, Lasix 20 mg daily, Coreg 25 mg twice daily.  Blood pressure within normal limits today.  No signs of fluid overload.  Follows with [facility]ology at [facility] prescribes her medications.  Malignant neoplasm of upper-outer quadrant of right breast in female, estrogen receptor positive ([place]) Reports use of oxybutynin, Lupron and Femara-prescribed for her oncologist.  She has routine follow-ups with her specialist.  Depression, recurrent (HCC)/Grief reaction Patient with increased depression after the loss of 1 of her sons.  She is also been battling breast cancer for the past 3 years. -She and her family are in therapy and receiving great benefit from those sessions.  Highly encouraged to continue attending sessions. -Continue Wellbutrin 300 mg daily.  This has been working for her up until the most recent  tragic event. -Discussed other options to add on for her and decided to try trazodone 25-100 mg nightly to start.  Tapering instructions were provided to her.  If needing additional coverage could consider Effexor in place of Wellbutrin.  She may even get benefit from hot flashes from her medication with that medication. -Follow-up 4 weeks, then every 6 months on depression.  Follow-up in 4 weeks for medication changes/follow-up on depression-then every 6 months on chronic medical conditions and Yearly CPE.  Greater than 30 minutes spent with patient, >50% of time spent face to face   Note is dictated utilizing voice recognition software. Although note has been proof read prior to signing, occasional typographical errors still can be missed. If any questions arise, please do not hesitate to call for verification.  Electronically signed by: Howard Pouch, DO [place]

## 2019-05-27 DIAGNOSIS — C50411 Malignant neoplasm of upper-outer quadrant of right female breast: Secondary | ICD-10-CM | POA: Diagnosis not present

## 2019-05-27 DIAGNOSIS — Z5111 Encounter for antineoplastic chemotherapy: Secondary | ICD-10-CM | POA: Diagnosis not present

## 2019-05-27 DIAGNOSIS — Z170 Estrogen receptor positive status [ER+]: Secondary | ICD-10-CM | POA: Diagnosis not present

## 2019-05-28 ENCOUNTER — Telehealth: Payer: Self-pay | Admitting: Family Medicine

## 2019-05-28 NOTE — Telephone Encounter (Signed)
I was reaching out to patient to schedule 4 week follow up with Dr. Raoul Pitch.  Scheduled patient on 9/1  FYI-- Patient wanted me to let Dr. Raoul Pitch know that new meds are making her joints ache a lot. She states that she can hardly rollover in bed. Patient did not want to stop meds yet, she was going to give meds a few more days to start working and will follow up with [place] then.

## 2019-05-28 NOTE — Telephone Encounter (Signed)
Sent to Dr Kuneff to review  

## 2019-05-28 NOTE — Telephone Encounter (Signed)
Please inform patient trazodone typically does not cause much in the way of muscle aches in patients.  It is very uncommon side effect, but it is a potential listed side effect for this medication-so it is possible if it is muscle pain it could be coming from the trazodone. Joint pain is not a listed potential side effect of this medication.  -  I would encourage her to continue for the next couple days and if still having them then she can stop the medicine and see if the cramps resolve, to ensure it is actually coming from the trazodone before discontinuing medicine altogether.

## 2019-05-31 NOTE — Telephone Encounter (Signed)
LMOM for pt to CB to discuss.  

## 2019-06-01 NOTE — Telephone Encounter (Signed)
Pt was called and message was left to return call on VM

## 2019-06-02 DIAGNOSIS — F4321 Adjustment disorder with depressed mood: Secondary | ICD-10-CM | POA: Diagnosis not present

## 2019-06-09 NOTE — Telephone Encounter (Signed)
Pt returned call and stated she did have to stop the Trazodone because it was sedating her and it felt like she took " a lot of benadryl". Pt is asking for appt to address her anxiety and short temper she has been having. Pt denials any suicidal thoughts. She was given Crisis hotline numbers if needed and advised to go to [facility] if needed, she verbalized understanding. Pt was offered appt with another provider but stated she could wait and see Dr Raoul Pitch. Pt was scheduled for Monday but was told to call if she needed to get in sooner with another provider since Dr Raliegh Ip was on vacation, she verbalized understanding

## 2019-06-14 ENCOUNTER — Other Ambulatory Visit: Payer: Self-pay

## 2019-06-14 ENCOUNTER — Ambulatory Visit: Payer: BC Managed Care – PPO | Admitting: Family Medicine

## 2019-06-14 ENCOUNTER — Encounter: Payer: Self-pay | Admitting: Family Medicine

## 2019-06-14 VITALS — BP 92/59 | HR 93 | Temp 98.4°F | Resp 17 | Ht 61.0 in | Wt 131.2 lb

## 2019-06-14 DIAGNOSIS — F4321 Adjustment disorder with depressed mood: Secondary | ICD-10-CM | POA: Diagnosis not present

## 2019-06-14 DIAGNOSIS — R319 Hematuria, unspecified: Secondary | ICD-10-CM

## 2019-06-14 DIAGNOSIS — R300 Dysuria: Secondary | ICD-10-CM

## 2019-06-14 DIAGNOSIS — F4320 Adjustment disorder, unspecified: Secondary | ICD-10-CM

## 2019-06-14 DIAGNOSIS — F339 Major depressive disorder, recurrent, unspecified: Secondary | ICD-10-CM

## 2019-06-14 LAB — POCT URINALYSIS DIPSTICK
Bilirubin, UA: NEGATIVE
Glucose, UA: NEGATIVE
Ketones, UA: NEGATIVE
Leukocytes, UA: NEGATIVE
Nitrite, UA: NEGATIVE
Protein, UA: NEGATIVE
Spec Grav, UA: 1.015 (ref 1.010–1.025)
Urobilinogen, UA: 0.2 E.U./dL
pH, UA: 6.5 (ref 5.0–8.0)

## 2019-06-14 MED ORDER — CEPHALEXIN 500 MG PO CAPS: 500.0000 mg | ORAL_CAPSULE | Freq: Four times a day (QID) | ORAL | 0 refills | Status: DC

## 2019-06-14 MED ORDER — FLUOXETINE HCL 20 MG PO TABS: 20.0000 mg | ORAL_TABLET | Freq: Every day | ORAL | 1 refills | Status: DC

## 2019-06-14 NOTE — Patient Instructions (Addendum)
Start keflex every 6 hours for 7 days. We will call you with culture.  FLUSH those kidneys increase water and lemonade (in the event of stone).  Advil for pain.  If pain worsens, fever develops on medication >> please go to ED or be seen immediatly.   Start fluoxetine, continue Wellbutrin.   Follow up 6 weeks.     Complicated Grief Grief is a normal response to the death of someone close to you. Feelings of fear, anger, and guilt can affect almost everyone who loses a loved one. It is also common to have symptoms of depression while you are grieving. These include problems with sleep, loss of appetite, and lack of energy. They may last for weeks or months after a loss. Complicated grief is different from normal grief or depression. Normal grieving involves sadness and feelings of loss, but those feelings get better and heal over time. Complicated grief is a severe type of grief that lasts for a long time, usually for several months to a year or longer. It interferes with your ability to function normally. Complicated grief may require treatment from a mental health care provider. What are the causes? The cause of this condition is not known. It is not clear why some people continue to struggle with grief and others do not. What increases the risk? You are more likely to develop this condition if:  The death of your loved one was sudden or unexpected.  The death of your loved one was due to a violent event.  Your loved one died from suicide.  Your loved one was a child or a young person.  You were very close to your loved one, or you were dependent on him or her.  You have a history of depression or anxiety. What are the signs or symptoms? Symptoms of this condition include:  Feeling disbelief or having a lack of emotion (numbness).  Being unable to enjoy good memories of your loved one.  Needing to avoid anything or anyone that reminds you of your loved one.  Being unable to  stop thinking about the death.  Feeling intense anger or guilt.  Feeling alone and hopeless.  Feeling that your life is meaningless and empty.  Losing the desire to move on with your life. How is this diagnosed? This condition may be diagnosed based on:  Your symptoms. Complicated grief will be diagnosed if you have ongoing symptoms of grief for 6-12 months or longer.  The effect of symptoms on your life. You may be diagnosed with this condition if your symptoms are interfering with your ability to live your life. Your health care provider may recommend that you see a mental health care provider. Many symptoms of depression are similar to the symptoms of complicated grief. It is important to be evaluated for complicated grief along with other mental health conditions. How is this treated? This condition is most commonly treated with talk therapy. This therapy is offered by a mental health specialist (psychiatrist). During therapy:  You will learn healthy ways to cope with the loss of your loved one.  Your mental health care provider may recommend antidepressant medicines. Follow these instructions at home: Lifestyle   Take care of yourself. ? Eat on a regular basis, and maintain a healthy diet. Eat plenty of fruits, vegetables, lean protein, and whole grains. ? Try to get some exercise each day. Aim for 30 minutes of exercise on most days of the week. ? Keep a consistent sleep schedule.  Try to get 8 or more hours of sleep each night. ? Start doing the things that you used to enjoy.  Do not use drugs or alcohol to ease your symptoms.  Spend time with friends and loved ones. General instructions  Take over-the-counter and prescription medicines only as told by your health care provider.  Consider joining a grief (bereavement) support group to help you deal with your loss.  Keep all follow-up visits as told by your health care provider. This is important. Contact a health care  provider if:  Your symptoms prevent you from functioning normally.  Your symptoms do not get better with treatment. Get help right away if:  You have serious thoughts about hurting yourself or someone else.  You have suicidal feelings. If you ever feel like you may hurt yourself or others, or have thoughts about taking your own life, get help right away. You can go to your nearest emergency department or call:  Your local emergency services (911 in the [place]  A suicide crisis helpline, such as the [facility] at (276)540-9436. This is open 24 hours a day. Summary  Complicated grief is a severe type of grief that lasts for a long time. This grief is not likely to go away on its own. Get the help you need.  Some griefs are more difficult than others and can cause this condition. You may need a certain type of treatment to help you recover if the loss of your loved one was sudden, violent, or due to suicide.  You may feel guilty about moving on with your life. Getting help does not mean that you are forgetting your loved one. It means that you are taking care of yourself.  Complicated grief is best treated with talk therapy. Medicines may also be prescribed.  Seek the help you need, and find support that will help you recover. This information is not intended to replace advice given to you by your health care provider. Make sure you discuss any questions you have with your health care provider. Document Released: 10/07/2005 Document Revised: 09/19/2017 Document Reviewed: 07/23/2017 Elsevier Patient Education  2020 [employer].

## 2019-06-14 NOTE — Progress Notes (Signed)
Carmen Shannon , March 28, 1971, 48 y.o., female MRN: [id number] Patient Care Team    Relationship Specialty Notifications Start End  Ma Hillock, DO PCP - General Family Medicine  05/25/19   Mollie Germany, MD Referring Physician Hematology and Oncology  05/25/19   Johnston Ebbs, [place]  Physician Assistant  05/25/19   Draeger, Stevphen Rochester, MD Referring Physician Orthopedic Surgery  05/25/19     Chief Complaint  Patient presents with  . Urinary Frequency    burning, urgency, no discharge. she has been taking azo. lower back pain.      Subjective: Pt presents for an OV with complaints of dysuria of 2 days duration.  Associated symptoms include low back discomfort.  SHe reports she felt feverish, but she did not have a temperature.  She has had urinary tract infections in the past, she does not recall what antibiotic is typically used for her UTIs.  She denies any kidney stone history for herself or in her family.  She does report taking Azo today for her symptoms.   Depression and anxiety: Patient also complains of being more irritable, anxious and short tempered.  She recently lost 1 of her twin sons that passed away last month.  She is also coping with the diagnosis of breast cancer.  She was seen and tried on trazodone, which she states gave her myalgias.  The myalgias stopped after she discontinued the trazodone.  She has continued her Wellbutrin 300 mg daily which has been a stable dose since 2017.  Her family continues to go to counseling together and separately.  She does admit she frequently questions if there is something she could have done differently, but she just saw no signs of her signs mental illness.  Prior note: Patient has a history of depression, that started in 2017.  She reports when she was diagnosed with breast cancer she was started on Wellbutrin to help with depression.  She reports her dose of 300 mg daily has been stable overall until recently.  2 weeks ago, 1 of her  twin sons had passed away.  His twin currently still lives at home with her, but is ready to start college.  she also has a daughter that lives in [place].  She reports her family has started counseling together and are getting great benefit from the sessions.  She does endorse increased depression and anxiety today.  She states she does not sleep well at all  Depression screen [facility] 2/9 05/25/2019  Decreased Interest 3  Down, Depressed, Hopeless 3  PHQ - 2 Score 6  Altered sleeping 3  Tired, decreased energy 3  Change in appetite 3  Feeling bad or failure about yourself  3  Trouble concentrating 3  Moving slowly or fidgety/restless 3  Suicidal thoughts 0  PHQ-9 Score 24  Difficult doing work/chores Very difficult   GAD 7 : Generalized Anxiety Score 05/25/2019  Nervous, Anxious, on Edge 3  Control/stop worrying 3  Worry too much - different things 3  Trouble relaxing 3  Restless 3  Easily annoyed or irritable 3  Afraid - awful might happen 3  Total GAD 7 Score 21  Anxiety Difficulty Very difficult    Allergies  Allergen Reactions  . Diphenhydramine Hcl Other (See Comments)    Paradoxical reaction.  "Hyper"   Social History   Social History Narrative   Marital status/children/pets: married. 2 living children (1 son (twin) deceased)   Education/employment: B.S. Engineer, site.  Past Medical History:  Diagnosis Date  . Breast cancer ([place])   . CHF (congestive heart failure) ([place]) 2017   From Chemo  . Chicken pox   . Depression   . Migraines   . Pleural effusion 02/05/2017  . Right thyroid nodule 05/29/2016   Past Surgical History:  Procedure Laterality Date  . BREAST BIOPSY  2017  . BUNIONECTOMY    . [place], 2001  . COLONOSCOPY  2019  . MASTECTOMY Bilateral 04/2019  . TONSILLECTOMY AND ADENOIDECTOMY    . WISDOM TOOTH EXTRACTION     Family History  Problem Relation Age of Onset  . Lung cancer Mother   . Alzheimer's disease Maternal  Grandmother   . Heart attack Maternal Grandfather    Allergies as of 06/14/2019      Reactions   Diphenhydramine Hcl Other (See Comments)   Paradoxical reaction.  "Hyper"      Medication List       Accurate as of June 14, 2019  3:38 PM. If you have any questions, ask your nurse or doctor.        buPROPion 300 MG 24 hr tablet Commonly known as: Wellbutrin XL Take 1 tablet (300 mg total) by mouth daily.   carvedilol 25 MG tablet Commonly known as: COREG Take by mouth 2 (two) times daily.   furosemide 20 MG tablet Commonly known as: LASIX Take by mouth.   letrozole 2.5 MG tablet Commonly known as: FEMARA Take by mouth.   leuprolide 11.25 MG Kit injection Commonly known as: LUPRON Inject into the muscle.   oxybutynin 5 MG tablet Commonly known as: DITROPAN Take 5 mg by mouth 2 (two) times daily.   SUMAtriptan 100 MG tablet Commonly known as: IMITREX Take by mouth.   traZODone 50 MG tablet Commonly known as: DESYREL Start 1/2 tab QHS, may increase every 3 days if needed until max dose of 100 mg QHS.   valACYclovir 1000 MG tablet Commonly known as: VALTREX   valsartan 160 MG tablet Commonly known as: DIOVAN       All past medical history, surgical history, allergies, family history, immunizations andmedications were updated in the EMR today and reviewed under the history and medication portions of their EMR.     ROS: Negative, with the exception of above mentioned in HPI   Objective:  BP (!) 92/59 (BP Location: Left Arm, Patient Position: Sitting, Cuff Size: Normal)   Pulse 93   Temp 98.4 F (36.9 C) (Temporal)   Resp 17   Ht 5' 1" (1.549 m)   Wt 131 lb 4 oz (59.5 kg)   SpO2 96%   BMI 24.80 kg/m  Body mass index is 24.8 kg/m. Gen: Afebrile. No acute distress. Nontoxic in appearance, well developed, well nourished.  HENT: AT. [place].  MMM Eyes:Pupils Equal Round Reactive to light, Extraocular movements intact,  Conjunctiva without redness, discharge  or icterus. CV: RRR  Abd: Soft. NTND. BS present.  No masses palpated. No rebound or guarding. MSK: No CVA tenderness bilaterally  Neuro:  Normal gait. PERLA. EOMi. Alert. Oriented x3  Psych: Tearful at times.  Normal affect, dress and demeanor. Normal speech. Normal thought content and judgment.  No SI or HI.  No exam data present No results found. No results found for this or any previous visit (from the past 24 hour(s)).  Assessment/Plan: Batool Majid is a 48 y.o. female present for OV for  Depression, recurrent (HCC)/Grief reaction Patient with increased  depression after the loss of 1 of her sons.  She is also been battling breast cancer for the past 3 years.   - Attempted to add trazodone, however she had side effects to that medication. -She and her family are in therapy and receiving great benefit from those sessions.  Highly encouraged to continue attending sessions. -Continue Wellbutrin 300 mg daily.  This has been working for her up until the most recent tragic event. -Discussed other options to add on today, and with more in-depth discussion we have decided to add fluoxetine 20 mg daily. -Follow-up in 6 weeks and depending upon therapeutic results may taper up at that time.  Dysuria/hematuria: -Point-of-care urinalysis with positive hematuria.  She had recently taking Azo therefore the plan of care will not be as effective.  Urine was sent for culture >> she will be called with these results once received. -Encouraged to hydrate.  Consider adding lemonade and NSAIDs if needed for pain or felt to be caused by kidney stone.  Advised her if pain worsens or she has a fever despite start of prophylactic antibiotic, she should be seen immediately. -Start Keflex 4 times daily x7 days.    Reviewed expectations re: course of current medical issues.  Discussed self-management of symptoms.  Outlined signs and symptoms indicating need for more acute intervention.  Patient  verbalized understanding and all questions were answered.  Patient received an After-Visit Summary.    Orders Placed This Encounter  Procedures  . POCT Urinalysis Dipstick   > 25 minutes spent with patient, >50% of time spent face to face     Note is dictated utilizing voice recognition software. Although note has been proof read prior to signing, occasional typographical errors still can be missed. If any questions arise, please do not hesitate to call for verification.   electronically signed by:  Howard Pouch, DO  [place]

## 2019-06-16 ENCOUNTER — Telehealth: Payer: Self-pay | Admitting: Family Medicine

## 2019-06-16 DIAGNOSIS — F4321 Adjustment disorder with depressed mood: Secondary | ICD-10-CM | POA: Diagnosis not present

## 2019-06-16 LAB — URINE CULTURE
MICRO NUMBER:: 804395
SPECIMEN QUALITY:: ADEQUATE

## 2019-06-16 NOTE — Telephone Encounter (Signed)
Called patient and she verbalized understanding  °

## 2019-06-16 NOTE — Telephone Encounter (Signed)
Please inform patient the following information: Her urine did show evidence of infection. The medication we prescribed will treat >> take as directed until completed.

## 2019-06-22 ENCOUNTER — Ambulatory Visit: Payer: BC Managed Care – PPO | Admitting: Family Medicine

## 2019-06-22 DIAGNOSIS — F4321 Adjustment disorder with depressed mood: Secondary | ICD-10-CM | POA: Diagnosis not present

## 2019-07-06 DIAGNOSIS — F4321 Adjustment disorder with depressed mood: Secondary | ICD-10-CM | POA: Diagnosis not present

## 2019-07-13 DIAGNOSIS — F4321 Adjustment disorder with depressed mood: Secondary | ICD-10-CM | POA: Diagnosis not present

## 2019-07-16 DIAGNOSIS — I509 Heart failure, unspecified: Secondary | ICD-10-CM | POA: Diagnosis not present

## 2019-07-16 DIAGNOSIS — I427 Cardiomyopathy due to drug and external agent: Secondary | ICD-10-CM | POA: Diagnosis not present

## 2019-07-16 DIAGNOSIS — Z9012 Acquired absence of left breast and nipple: Secondary | ICD-10-CM | POA: Diagnosis not present

## 2019-07-16 DIAGNOSIS — Z9221 Personal history of antineoplastic chemotherapy: Secondary | ICD-10-CM | POA: Diagnosis not present

## 2019-07-16 DIAGNOSIS — Z170 Estrogen receptor positive status [ER+]: Secondary | ICD-10-CM | POA: Diagnosis not present

## 2019-07-16 DIAGNOSIS — E2839 Other primary ovarian failure: Secondary | ICD-10-CM | POA: Diagnosis not present

## 2019-07-16 DIAGNOSIS — Z5181 Encounter for therapeutic drug level monitoring: Secondary | ICD-10-CM | POA: Diagnosis not present

## 2019-07-16 DIAGNOSIS — Z79811 Long term (current) use of aromatase inhibitors: Secondary | ICD-10-CM | POA: Diagnosis not present

## 2019-07-16 DIAGNOSIS — C50411 Malignant neoplasm of upper-outer quadrant of right female breast: Secondary | ICD-10-CM | POA: Diagnosis not present

## 2019-07-16 DIAGNOSIS — F329 Major depressive disorder, single episode, unspecified: Secondary | ICD-10-CM | POA: Diagnosis not present

## 2019-07-16 DIAGNOSIS — R232 Flushing: Secondary | ICD-10-CM | POA: Diagnosis not present

## 2019-07-16 DIAGNOSIS — Z923 Personal history of irradiation: Secondary | ICD-10-CM | POA: Diagnosis not present

## 2019-07-16 DIAGNOSIS — Z9289 Personal history of other medical treatment: Secondary | ICD-10-CM | POA: Diagnosis not present

## 2019-07-16 DIAGNOSIS — T451X5A Adverse effect of antineoplastic and immunosuppressive drugs, initial encounter: Secondary | ICD-10-CM | POA: Diagnosis not present

## 2019-07-20 DIAGNOSIS — F4321 Adjustment disorder with depressed mood: Secondary | ICD-10-CM | POA: Diagnosis not present

## 2019-07-26 ENCOUNTER — Encounter: Payer: Self-pay | Admitting: Family Medicine

## 2019-07-27 DIAGNOSIS — F4321 Adjustment disorder with depressed mood: Secondary | ICD-10-CM | POA: Diagnosis not present

## 2019-07-29 DIAGNOSIS — G5600 Carpal tunnel syndrome, unspecified upper limb: Secondary | ICD-10-CM | POA: Diagnosis not present

## 2019-08-03 DIAGNOSIS — F4321 Adjustment disorder with depressed mood: Secondary | ICD-10-CM | POA: Diagnosis not present

## 2019-08-11 DIAGNOSIS — Z20828 Contact with and (suspected) exposure to other viral communicable diseases: Secondary | ICD-10-CM | POA: Diagnosis not present

## 2019-08-11 DIAGNOSIS — Z01812 Encounter for preprocedural laboratory examination: Secondary | ICD-10-CM | POA: Diagnosis not present

## 2019-08-11 DIAGNOSIS — I509 Heart failure, unspecified: Secondary | ICD-10-CM | POA: Diagnosis not present

## 2019-08-11 DIAGNOSIS — Z01818 Encounter for other preprocedural examination: Secondary | ICD-10-CM | POA: Diagnosis not present

## 2019-08-13 DIAGNOSIS — I509 Heart failure, unspecified: Secondary | ICD-10-CM | POA: Diagnosis not present

## 2019-08-13 DIAGNOSIS — F329 Major depressive disorder, single episode, unspecified: Secondary | ICD-10-CM | POA: Diagnosis not present

## 2019-08-13 DIAGNOSIS — Z9221 Personal history of antineoplastic chemotherapy: Secondary | ICD-10-CM | POA: Diagnosis not present

## 2019-08-13 DIAGNOSIS — Z87891 Personal history of nicotine dependence: Secondary | ICD-10-CM | POA: Diagnosis not present

## 2019-08-13 DIAGNOSIS — G5601 Carpal tunnel syndrome, right upper limb: Secondary | ICD-10-CM | POA: Diagnosis not present

## 2019-08-13 DIAGNOSIS — Z853 Personal history of malignant neoplasm of breast: Secondary | ICD-10-CM | POA: Diagnosis not present

## 2019-08-13 DIAGNOSIS — D649 Anemia, unspecified: Secondary | ICD-10-CM | POA: Diagnosis not present

## 2019-08-16 ENCOUNTER — Telehealth: Payer: Self-pay

## 2019-08-16 DIAGNOSIS — C50411 Malignant neoplasm of upper-outer quadrant of right female breast: Secondary | ICD-10-CM | POA: Diagnosis not present

## 2019-08-16 DIAGNOSIS — Z170 Estrogen receptor positive status [ER+]: Secondary | ICD-10-CM | POA: Diagnosis not present

## 2019-08-16 DIAGNOSIS — Z5111 Encounter for antineoplastic chemotherapy: Secondary | ICD-10-CM | POA: Diagnosis not present

## 2019-08-16 NOTE — Telephone Encounter (Signed)
Pt was called and scheduled for virtual visit tomorrow. She has enough medication to last until tomorrow.

## 2019-08-16 NOTE — Telephone Encounter (Signed)
Received faxed refill request for patients Fluoxetine HCL 20mg  tablet. Pt was last seen 06/14/2019 and was to be rechecked in 6 weeks. Pt needs to be called and set up with F/U appt.

## 2019-08-17 ENCOUNTER — Other Ambulatory Visit: Payer: Self-pay

## 2019-08-17 ENCOUNTER — Encounter: Payer: Self-pay | Admitting: Family Medicine

## 2019-08-17 ENCOUNTER — Ambulatory Visit (INDEPENDENT_AMBULATORY_CARE_PROVIDER_SITE_OTHER): Payer: BC Managed Care – PPO | Admitting: Family Medicine

## 2019-08-17 VITALS — Ht 61.0 in | Wt 133.0 lb

## 2019-08-17 DIAGNOSIS — F339 Major depressive disorder, recurrent, unspecified: Secondary | ICD-10-CM | POA: Diagnosis not present

## 2019-08-17 DIAGNOSIS — F4321 Adjustment disorder with depressed mood: Secondary | ICD-10-CM

## 2019-08-17 MED ORDER — FLUOXETINE HCL 20 MG PO TABS: 40.0000 mg | ORAL_TABLET | Freq: Every day | ORAL | 1 refills | Status: DC

## 2019-08-17 MED ORDER — BUPROPION HCL ER (XL) 300 MG PO TB24: 300.0000 mg | ORAL_TABLET | Freq: Every day | ORAL | 0 refills | Status: DC

## 2019-08-17 NOTE — Progress Notes (Addendum)
VIRTUAL VISIT VIA VIDEO  I connected with Carmen Shannon on 08/17/2019 at  2:00 PM EDT by a video enabled telemedicine application and verified that I am speaking with the correct person using two identifiers. Location patient: Home Location provider: [facility], Office Persons participating in the virtual visit: Patient, Dr. Raoul Pitch and R.Baker, LPN  I discussed the limitations of evaluation and management by telemedicine and the availability of in person appointments. The patient expressed understanding and agreed to proceed.      Patient ID: Carmen Shannon, female  DOB: September 09, 1971, 48 y.o.   MRN: [id number] Patient Care Team    Relationship Specialty Notifications Start End  Ma Hillock, DO PCP - General Family Medicine  05/25/19   Mollie Germany, MD Referring Physician Hematology and Oncology  05/25/19   Johnston Ebbs, [place]  Physician Assistant  05/25/19   Draeger, Stevphen Rochester, MD Referring Physician Orthopedic Surgery  05/25/19     Chief Complaint  Patient presents with  . Depression    Pt is doing well and not having as many racing thoughts but does feel she may need another medication     Subjective:  Carmen Shannon is a 48 y.o.  female present for follow up.  Patient reports she is doing rather well with the Wellbutrin 300 mg daily and the add on fluoxetine 20 mg daily.  She still feels she may need some additional coverage, but has noticed a great deal of improvement.  She tried the trazodone to help her with sleep, but she felt it made her feel off and jittery.  She reports now that she has been on the fluoxetine she has found she is sleeping much better and does not need the trazodone anyway.  She reports she still has times where she feels overwhelmed with sadness over losing her son, but the majority of the days she is coping. Prior note:  Patient has a history of depression, that started in 2017.  She reports when she was diagnosed with breast cancer she was  started on Wellbutrin to help with depression.  She reports her dose of 300 mg daily has been stable overall until recently.  2 weeks ago, 1 of her twin sons had passed away.  His twin currently still lives at home with her, but is ready to start college.  she also has a daughter that lives in [place].  She reports her family has started counseling together and are getting great benefit from the sessions.  She does endorse increased depression and anxiety today.  She states she does not sleep well at all.  Depression screen [facility] 2/9 08/17/2019 05/25/2019  Decreased Interest 1 3  Down, Depressed, Hopeless 3 3  PHQ - 2 Score 4 6  Altered sleeping 3 3  Tired, decreased energy 3 3  Change in appetite 0 3  Feeling bad or failure about yourself  0 3  Trouble concentrating 1 3  Moving slowly or fidgety/restless 0 3  Suicidal thoughts 0 0  PHQ-9 Score 11 24  Difficult doing work/chores Somewhat difficult Very difficult   GAD 7 : Generalized Anxiety Score 05/25/2019  Nervous, Anxious, on Edge 3  Control/stop worrying 3  Worry too much - different things 3  Trouble relaxing 3  Restless 3  Easily annoyed or irritable 3  Afraid - awful might happen 3  Total GAD 7 Score 21  Anxiety Difficulty Very difficult       No flowsheet  data found.  Immunization History  Administered Date(s) Administered  . Hepatitis A 04/28/2017, 01/26/2018  . Influenza,inj,Quad PF,6+ Mos 08/05/2017  . Tdap 04/28/2017    No exam data present  Past Medical History:  Diagnosis Date  . Breast cancer ([place])   . CHF (congestive heart failure) ([place]) 2017   From Chemo  . Chicken pox   . Depression   . Migraines   . Pleural effusion 02/05/2017  . Right thyroid nodule 05/29/2016   Allergies  Allergen Reactions  . Diphenhydramine Hcl Other (See Comments)    Paradoxical reaction.  "Hyper"   Past Surgical History:  Procedure Laterality Date  . BREAST BIOPSY  2017  . BUNIONECTOMY    . [place],  2001  . COLONOSCOPY  2019  . MASTECTOMY Bilateral 04/2019  . TONSILLECTOMY AND ADENOIDECTOMY    . WISDOM TOOTH EXTRACTION     Family History  Problem Relation Age of Onset  . Lung cancer Mother   . Alzheimer's disease Maternal Grandmother   . Heart attack Maternal Grandfather    Social History   Social History Narrative   Marital status/children/pets: married. 2 living children (1 son (twin) deceased)   Education/employment: B.S. Engineer, site.        Allergies as of 08/17/2019      Reactions   Diphenhydramine Hcl Other (See Comments)   Paradoxical reaction.  "Hyper"      Medication List       Accurate as of August 17, 2019  2:08 PM. If you have any questions, ask your nurse or doctor.        buPROPion 300 MG 24 hr tablet Commonly known as: Wellbutrin XL Take 1 tablet (300 mg total) by mouth daily.   carvedilol 25 MG tablet Commonly known as: COREG Take by mouth 2 (two) times daily.   cephALEXin 500 MG capsule Commonly known as: KEFLEX Take 1 capsule (500 mg total) by mouth 4 (four) times daily.   FLUoxetine 20 MG tablet Commonly known as: PROZAC Take 1 tablet (20 mg total) by mouth daily.   furosemide 20 MG tablet Commonly known as: LASIX Take by mouth.   letrozole 2.5 MG tablet Commonly known as: FEMARA Take by mouth.   leuprolide 11.25 MG Kit injection Commonly known as: LUPRON Inject into the muscle.   oxybutynin 5 MG tablet Commonly known as: DITROPAN Take 5 mg by mouth 2 (two) times daily.   SUMAtriptan 100 MG tablet Commonly known as: IMITREX Take by mouth.   valACYclovir 1000 MG tablet Commonly known as: VALTREX   valsartan 160 MG tablet Commonly known as: DIOVAN       All past medical history, surgical history, allergies, family history, immunizations andmedications were updated in the EMR today and reviewed under the history and medication portions of their EMR.     Patient was never admitted.   ROS: 14 pt review of  systems performed and negative (unless mentioned in an HPI)  Objective: Ht '5\' 1"'  (1.549 m)   Wt 133 lb (60.3 kg)   BMI 25.13 kg/m  Gen: Afebrile. No acute distress.  HENT: AT. [place].  Neuro:  Alert. Oriented x3  Psych: Normal affect, dress and demeanor. Normal speech. Normal thought content and judgment.  Assessment/plan: Carmen Shannon is a 48 y.o. female present for followup Depression, recurrent (HCC)/Grief reaction -Depression and anxiety is improving with the addition of fluoxetine, she feels she could still use some additional coverage. -Increase fluoxetine to  40 mg daily.  90-day supply, +1 refill was prescribed today. -Continue Wellbutrin 300 mg daily. -Patient did not like the way trazodone made her feel, but is now sleeping much better.  DC trazodone. - depression after the loss of 1 of her sons.  She is also been battling breast cancer for the past 3 years. -She and her family are in therapy and receiving great benefit from those sessions.  Highly encouraged to continue attending sessions. -Follow-up in 6 months, sooner if needed.  > 15 minutes spent with patient, > 50% of that time face to face   Note is dictated utilizing voice recognition software. Although note has been proof read prior to signing, occasional typographical errors still can be missed. If any questions arise, please do not hesitate to call for verification.  Electronically signed by: Howard Pouch, DO [place]

## 2019-08-24 DIAGNOSIS — F4321 Adjustment disorder with depressed mood: Secondary | ICD-10-CM | POA: Diagnosis not present

## 2019-08-25 ENCOUNTER — Encounter: Payer: Self-pay | Admitting: Podiatry

## 2019-08-25 ENCOUNTER — Ambulatory Visit: Payer: BC Managed Care – PPO | Admitting: Podiatry

## 2019-08-25 ENCOUNTER — Ambulatory Visit (INDEPENDENT_AMBULATORY_CARE_PROVIDER_SITE_OTHER): Payer: BC Managed Care – PPO

## 2019-08-25 ENCOUNTER — Other Ambulatory Visit: Payer: Self-pay

## 2019-08-25 VITALS — BP 97/62 | HR 85 | Resp 16

## 2019-08-25 DIAGNOSIS — M2042 Other hammer toe(s) (acquired), left foot: Secondary | ICD-10-CM

## 2019-08-25 DIAGNOSIS — M2041 Other hammer toe(s) (acquired), right foot: Secondary | ICD-10-CM | POA: Diagnosis not present

## 2019-08-25 DIAGNOSIS — M779 Enthesopathy, unspecified: Secondary | ICD-10-CM | POA: Diagnosis not present

## 2019-08-25 NOTE — Patient Instructions (Signed)
Hammer Toe  Hammer toe is a change in the shape (a deformity) of your toe. The deformity causes the middle joint of your toe to stay bent. This causes pain, especially when you are wearing shoes. Hammer toe starts gradually. At first, the toe can be straightened. Gradually over time, the deformity becomes stiff and permanent. Early treatments to keep the toe straight may relieve pain. As the deformity becomes stiff and permanent, surgery may be needed to straighten the toe. What are the causes? Hammer toe is caused by abnormal bending of the toe joint that is closest to your foot. It happens gradually over time. This pulls on the muscles and connections (tendons) of the toe joint, making them weak and stiff. It is often related to wearing shoes that are too short or narrow and do not let your toes straighten. What increases the risk? You may be at greater risk for hammer toe if you:  Are female.  Are older.  Wear shoes that are too small.  Wear high-heeled shoes that pinch your toes.  Are a ballet dancer.  Have a second toe that is longer than your big toe (first toe).  Injure your foot or toe.  Have arthritis.  Have a family history of hammer toe.  Have a nerve or muscle disorder. What are the signs or symptoms? The main symptoms of this condition are pain and deformity of the toe. The pain is worse when wearing shoes, walking, or running. Other symptoms may include:  Corns or calluses over the bent part of the toe or between the toes.  Redness and a burning feeling on the toe.  An open sore that forms on the top of the toe.  Not being able to straighten the toe. How is this diagnosed? This condition is diagnosed based on your symptoms and a physical exam. During the exam, your health care provider will try to straighten your toe to see how stiff the deformity is. You may also have tests, such as:  A blood test to check for rheumatoid arthritis.  An X-ray to show how  severe the deformity is. How is this treated? Treatment for this condition will depend on how stiff the deformity is. Surgery is often needed. However, sometimes a hammer toe can be straightened without surgery. Treatments that do not involve surgery include:  Taping the toe into a straightened position.  Using pads and cushions to protect the toe (orthotics).  Wearing shoes that provide enough room for the toes.  Doing toe-stretching exercises at home.  Taking an NSAID to reduce pain and swelling. If these treatments do not help or the toe cannot be straightened, surgery is the next option. The most common surgeries used to straighten a hammer toe include:  Arthroplasty. In this procedure, part of the joint is removed, and that allows the toe to straighten.  Fusion. In this procedure, cartilage between the two bones of the joint is taken out and the bones are fused together into one longer bone.  Implantation. In this procedure, part of the bone is removed and replaced with an implant to let the toe move again.  Flexor tendon transfer. In this procedure, the tendons that curl the toes down (flexor tendons) are repositioned. Follow these instructions at home:  Take over-the-counter and prescription medicines only as told by your health care provider.  Do toe straightening and stretching exercises as told by your health care provider.  Keep all follow-up visits as told by your health care   provider. This is important. How is this prevented?  Wear shoes that give your toes enough room and do not cause pain.  Do not wear high-heeled shoes. Contact a health care provider if:  Your pain gets worse.  Your toe becomes red or swollen.  You develop an open sore on your toe. This information is not intended to replace advice given to you by your health care provider. Make sure you discuss any questions you have with your health care provider. Document Released: 10/04/2000 Document  Revised: 09/19/2017 Document Reviewed: 01/31/2016 Elsevier Patient Education  2020 Elsevier Inc.  

## 2019-08-25 NOTE — Progress Notes (Signed)
   Subjective:    Patient ID: Carmen Shannon, female    DOB: 1971/07/06, 48 y.o.   MRN: [id number]  HPI    Review of Systems  All other systems reviewed and are negative.      Objective:   Physical Exam        Assessment & Plan:

## 2019-08-26 NOTE — Progress Notes (Signed)
Subjective:   Patient ID: Carmen Shannon, female   DOB: 48 y.o.   MRN: [id number]   HPI Patient presents stating she has had a lot of pain in her right over left foot over the last 6 months and she has been on chemo in the past.  States that the second MPJ right has become very sore makes it hard for her to be active and she feels like the toe has moved more over that time.  Patient does not smoke and has been treated for breast cancer over the last several years   Review of Systems  All other systems reviewed and are negative.       Objective:  Physical Exam Vitals signs and nursing note reviewed.  Constitutional:      Appearance: She is well-developed.  Pulmonary:     Effort: Pulmonary effort is normal.  Musculoskeletal: Normal range of motion.  Skin:    General: Skin is warm.  Neurological:     Mental Status: She is alert.     Neurovascular status intact muscle strength found to be adequate range of motion within normal limits.  Patient is found to have exquisite discomfort second MPJ right with inflammation fluid and mild to moderate on the left.  There is elevation of the second toe and what appears to be thickening of the joint surface.  Patient has good digital perfusion well oriented x3     Assessment:  Inflammatory capsulitis second MPJ right with possible flexor plate disruption and mild discomfort on the left     Plan:  H&P x-rays reviewed and x-ray discussed with patient.  Today I went ahead and I did a sterile prep of the right forefoot I aspirated the second MPJ getting out fluid that was slightly discolored indicating there may be some flexor plate damage but no other pathology was noted.  I then placed in a small amount of dexamethasone Kenalog applied thick plantar pad to reduce pressure on the joint surface I discussed long-term orthotics with the possibility that this may require digital fusion with metatarsal osteotomy.  Also reviewed x-rays that there is some  appearances of arthritis around the second MPJ  X-rays indicated that the second MPJ right does have some changes around the joint surface with a small cyst that I want to watch as we had into the future

## 2019-08-27 DIAGNOSIS — F329 Major depressive disorder, single episode, unspecified: Secondary | ICD-10-CM | POA: Diagnosis not present

## 2019-08-27 DIAGNOSIS — G5602 Carpal tunnel syndrome, left upper limb: Secondary | ICD-10-CM | POA: Diagnosis not present

## 2019-08-27 DIAGNOSIS — Z853 Personal history of malignant neoplasm of breast: Secondary | ICD-10-CM | POA: Diagnosis not present

## 2019-08-27 DIAGNOSIS — Z87891 Personal history of nicotine dependence: Secondary | ICD-10-CM | POA: Diagnosis not present

## 2019-09-09 ENCOUNTER — Ambulatory Visit: Payer: BC Managed Care – PPO | Admitting: Podiatry

## 2019-09-13 DIAGNOSIS — C50411 Malignant neoplasm of upper-outer quadrant of right female breast: Secondary | ICD-10-CM | POA: Diagnosis not present

## 2019-09-13 DIAGNOSIS — Z170 Estrogen receptor positive status [ER+]: Secondary | ICD-10-CM | POA: Diagnosis not present

## 2019-09-21 DIAGNOSIS — F4321 Adjustment disorder with depressed mood: Secondary | ICD-10-CM | POA: Diagnosis not present

## 2019-09-27 DIAGNOSIS — Z01818 Encounter for other preprocedural examination: Secondary | ICD-10-CM | POA: Diagnosis not present

## 2019-09-27 DIAGNOSIS — Z01810 Encounter for preprocedural cardiovascular examination: Secondary | ICD-10-CM | POA: Diagnosis not present

## 2019-09-28 DIAGNOSIS — F4321 Adjustment disorder with depressed mood: Secondary | ICD-10-CM | POA: Diagnosis not present

## 2019-10-02 DIAGNOSIS — Z01812 Encounter for preprocedural laboratory examination: Secondary | ICD-10-CM | POA: Diagnosis not present

## 2019-10-02 DIAGNOSIS — Z20828 Contact with and (suspected) exposure to other viral communicable diseases: Secondary | ICD-10-CM | POA: Diagnosis not present

## 2019-10-04 DIAGNOSIS — J90 Pleural effusion, not elsewhere classified: Secondary | ICD-10-CM | POA: Diagnosis not present

## 2019-10-04 DIAGNOSIS — G43909 Migraine, unspecified, not intractable, without status migrainosus: Secondary | ICD-10-CM | POA: Diagnosis not present

## 2019-10-04 DIAGNOSIS — T85898A Other specified complication of other internal prosthetic devices, implants and grafts, initial encounter: Secondary | ICD-10-CM | POA: Diagnosis not present

## 2019-10-04 DIAGNOSIS — Z87891 Personal history of nicotine dependence: Secondary | ICD-10-CM | POA: Diagnosis not present

## 2019-10-04 DIAGNOSIS — N6489 Other specified disorders of breast: Secondary | ICD-10-CM | POA: Diagnosis not present

## 2019-10-04 DIAGNOSIS — T66XXXS Radiation sickness, unspecified, sequela: Secondary | ICD-10-CM | POA: Diagnosis not present

## 2019-10-04 DIAGNOSIS — T8542XA Displacement of breast prosthesis and implant, initial encounter: Secondary | ICD-10-CM | POA: Diagnosis not present

## 2019-10-04 DIAGNOSIS — Z853 Personal history of malignant neoplasm of breast: Secondary | ICD-10-CM | POA: Diagnosis not present

## 2019-10-04 DIAGNOSIS — I5021 Acute systolic (congestive) heart failure: Secondary | ICD-10-CM | POA: Diagnosis not present

## 2019-10-11 DIAGNOSIS — C50411 Malignant neoplasm of upper-outer quadrant of right female breast: Secondary | ICD-10-CM | POA: Diagnosis not present

## 2019-10-11 DIAGNOSIS — Z170 Estrogen receptor positive status [ER+]: Secondary | ICD-10-CM | POA: Diagnosis not present

## 2019-11-02 DIAGNOSIS — F4321 Adjustment disorder with depressed mood: Secondary | ICD-10-CM | POA: Diagnosis not present

## 2019-11-08 DIAGNOSIS — Z9221 Personal history of antineoplastic chemotherapy: Secondary | ICD-10-CM | POA: Diagnosis not present

## 2019-11-08 DIAGNOSIS — Z9289 Personal history of other medical treatment: Secondary | ICD-10-CM | POA: Diagnosis not present

## 2019-11-08 DIAGNOSIS — Z923 Personal history of irradiation: Secondary | ICD-10-CM | POA: Diagnosis not present

## 2019-11-08 DIAGNOSIS — F419 Anxiety disorder, unspecified: Secondary | ICD-10-CM | POA: Diagnosis not present

## 2019-11-08 DIAGNOSIS — Z5181 Encounter for therapeutic drug level monitoring: Secondary | ICD-10-CM | POA: Diagnosis not present

## 2019-11-08 DIAGNOSIS — I427 Cardiomyopathy due to drug and external agent: Secondary | ICD-10-CM | POA: Diagnosis not present

## 2019-11-08 DIAGNOSIS — C50411 Malignant neoplasm of upper-outer quadrant of right female breast: Secondary | ICD-10-CM | POA: Diagnosis not present

## 2019-11-08 DIAGNOSIS — I509 Heart failure, unspecified: Secondary | ICD-10-CM | POA: Diagnosis not present

## 2019-11-08 DIAGNOSIS — F329 Major depressive disorder, single episode, unspecified: Secondary | ICD-10-CM | POA: Diagnosis not present

## 2019-11-08 DIAGNOSIS — Z170 Estrogen receptor positive status [ER+]: Secondary | ICD-10-CM | POA: Diagnosis not present

## 2019-11-08 DIAGNOSIS — G43909 Migraine, unspecified, not intractable, without status migrainosus: Secondary | ICD-10-CM | POA: Diagnosis not present

## 2019-11-08 DIAGNOSIS — Z9012 Acquired absence of left breast and nipple: Secondary | ICD-10-CM | POA: Diagnosis not present

## 2019-11-08 DIAGNOSIS — Z9189 Other specified personal risk factors, not elsewhere classified: Secondary | ICD-10-CM | POA: Diagnosis not present

## 2019-11-16 DIAGNOSIS — F4321 Adjustment disorder with depressed mood: Secondary | ICD-10-CM | POA: Diagnosis not present

## 2019-11-30 DIAGNOSIS — F4321 Adjustment disorder with depressed mood: Secondary | ICD-10-CM | POA: Diagnosis not present

## 2019-12-06 DIAGNOSIS — Z13820 Encounter for screening for osteoporosis: Secondary | ICD-10-CM | POA: Diagnosis not present

## 2019-12-06 DIAGNOSIS — E2839 Other primary ovarian failure: Secondary | ICD-10-CM | POA: Diagnosis not present

## 2019-12-06 DIAGNOSIS — M8580 Other specified disorders of bone density and structure, unspecified site: Secondary | ICD-10-CM | POA: Diagnosis not present

## 2019-12-06 DIAGNOSIS — Z170 Estrogen receptor positive status [ER+]: Secondary | ICD-10-CM | POA: Diagnosis not present

## 2019-12-06 DIAGNOSIS — C50411 Malignant neoplasm of upper-outer quadrant of right female breast: Secondary | ICD-10-CM | POA: Diagnosis not present

## 2019-12-06 DIAGNOSIS — Z780 Asymptomatic menopausal state: Secondary | ICD-10-CM | POA: Diagnosis not present

## 2019-12-06 DIAGNOSIS — Z9189 Other specified personal risk factors, not elsewhere classified: Secondary | ICD-10-CM | POA: Diagnosis not present

## 2019-12-28 DIAGNOSIS — F4321 Adjustment disorder with depressed mood: Secondary | ICD-10-CM | POA: Diagnosis not present

## 2020-01-03 DIAGNOSIS — Z170 Estrogen receptor positive status [ER+]: Secondary | ICD-10-CM | POA: Diagnosis not present

## 2020-01-03 DIAGNOSIS — C50411 Malignant neoplasm of upper-outer quadrant of right female breast: Secondary | ICD-10-CM | POA: Diagnosis not present

## 2020-01-20 ENCOUNTER — Other Ambulatory Visit: Payer: Self-pay | Admitting: Family Medicine

## 2020-01-20 NOTE — Telephone Encounter (Signed)
Patient leaving to go out of town in about 30 minutes, [facility] just now sending [facility]ill request over that she sent a request for them 3 days ago.   This is not an error on our part however, I felt patient needed high priority on this, since they are leaving town in 30 minutes.   [facility]   Patient can be reached at  [phone]

## 2020-01-20 NOTE — Telephone Encounter (Signed)
Pt is due the end of April for appt. Two weeks worth of med sent to pharmacy. Pt notified she needed to make appt for refills

## 2020-02-02 DIAGNOSIS — C50411 Malignant neoplasm of upper-outer quadrant of right female breast: Secondary | ICD-10-CM | POA: Diagnosis not present

## 2020-02-02 DIAGNOSIS — Z5111 Encounter for antineoplastic chemotherapy: Secondary | ICD-10-CM | POA: Diagnosis not present

## 2020-02-02 DIAGNOSIS — Z170 Estrogen receptor positive status [ER+]: Secondary | ICD-10-CM | POA: Diagnosis not present

## 2020-02-08 DIAGNOSIS — F4321 Adjustment disorder with depressed mood: Secondary | ICD-10-CM | POA: Diagnosis not present

## 2020-02-11 ENCOUNTER — Other Ambulatory Visit: Payer: Self-pay

## 2020-02-11 ENCOUNTER — Ambulatory Visit: Payer: BC Managed Care – PPO | Admitting: Family Medicine

## 2020-02-11 ENCOUNTER — Encounter: Payer: Self-pay | Admitting: Family Medicine

## 2020-02-11 VITALS — BP 102/70 | HR 80 | Temp 98.0°F | Resp 16 | Ht 61.0 in | Wt 142.0 lb

## 2020-02-11 DIAGNOSIS — F339 Major depressive disorder, recurrent, unspecified: Secondary | ICD-10-CM | POA: Diagnosis not present

## 2020-02-11 DIAGNOSIS — F4321 Adjustment disorder with depressed mood: Secondary | ICD-10-CM | POA: Diagnosis not present

## 2020-02-11 DIAGNOSIS — F4320 Adjustment disorder, unspecified: Secondary | ICD-10-CM

## 2020-02-11 DIAGNOSIS — F419 Anxiety disorder, unspecified: Secondary | ICD-10-CM

## 2020-02-11 MED ORDER — QUETIAPINE FUMARATE 50 MG PO TABS: ORAL_TABLET | ORAL | 1 refills | Status: DC

## 2020-02-11 MED ORDER — BUPROPION HCL ER (XL) 300 MG PO TB24: 300.0000 mg | ORAL_TABLET | Freq: Every day | ORAL | 1 refills | Status: DC

## 2020-02-11 NOTE — Progress Notes (Signed)
Patient ID: Carmen Shannon, female  DOB: 19-Jun-1971, 49 y.o.   MRN: [id number] Patient Care Team    Relationship Specialty Notifications Start End  Ma Hillock, DO PCP - General Family Medicine  05/25/19   Mollie Germany, MD Referring Physician Hematology and Oncology  05/25/19   Johnston Ebbs, [place]  Physician Assistant  05/25/19   Draeger, Stevphen Rochester, MD Referring Physician Orthopedic Surgery  05/25/19     Chief Complaint  Patient presents with  . Medication Refill    put on prozac-not working    Subjective: Carmen Shannon is a 49 y.o.  female present for f/u [facility] Depression with anxiety:  Patient presents today to discuss her depression and anxiety.  At her last appointment we continued Wellbutrin and increase the fluoxetine to 40 mg daily.  She states at first she really thought this was helping her a great deal.  However over the last few months she does not feel its been as helpful.  She has more anxiety.  She is having an extreme difficulty sleeping.  She had been tried on trazodone as well a few months ago, but felt more jittery on this medication. Prior note:  Patient reports she is doing rather well with the Wellbutrin 300 mg daily and the add on fluoxetine 20 mg daily.  She still feels she may need some additional coverage, but has noticed a great deal of improvement.  She tried the trazodone to help her with sleep, but she felt it made her feel off and jittery.  She reports now that she has been on the fluoxetine she has found she is sleeping much better and does not need the trazodone anyway.  She reports she still has times where she feels overwhelmed with sadness over losing her son, but the majority of the days she is coping. Prior note: Patient has a history of depression, that started in 2017.  She reports when she was diagnosed with breast cancer she was started on Wellbutrin to help with depression.  She reports her dose of 300 mg daily has been stable overall until  recently.  2 weeks ago, 1 of her twin sons had passed away.  His twin currently still lives at home with her, but is ready to start college.  she also has a daughter that lives in [place].  She reports her family has started counseling together and are getting great benefit from the sessions.  She does endorse increased depression and anxiety today.  She states she does not sleep well at all.  Depression screen [facility] 2/9 02/11/2020 08/17/2019 05/25/2019  Decreased Interest _0 Down, Depressed, Hopeless _1 PHQ - 2 Score _2 Altered sleeping _3 Tired, decreased energy _4 Change in appetite 0 0 3  Feeling bad or failure about yourself  3 0 3  Trouble concentrating _5 Moving slowly or fidgety/restless 0 0 3  Suicidal thoughts 0 0 0  PHQ-9 Score _6 Difficult doing work/chores Very difficult Somewhat difficult Very difficult   GAD 7 : Generalized Anxiety Score 05/25/2019  Nervous, Anxious, on Edge 3  Control/stop worrying 3  Worry too much - different things 3  Trouble relaxing 3  Restless 3  Easily annoyed or irritable 3  Afraid - awful might happen 3  Total GAD 7 Score 21  Anxiety Difficulty Very difficult  No flowsheet data found.  Immunization History  Administered Date(s) Administered  . Hepatitis A 04/28/2017, 01/26/2018  . Influenza Inj Mdck Quad Pf 08/05/2019  . Influenza,inj,Quad PF,6+ Mos 08/05/2017  . Tdap 04/28/2017    No exam data present  Past Medical History:  Diagnosis Date  . Breast cancer ([place])   . CHF (congestive heart failure) ([place]) 2017   From Chemo  . Chicken pox   . Depression   . Migraines   . Pleural effusion 02/05/2017  . Right thyroid nodule 05/29/2016   Allergies  Allergen Reactions  . Diphenhydramine Hcl Other (See Comments)    Paradoxical reaction.  "Hyper"   Past Surgical History:  Procedure Laterality Date  . BREAST BIOPSY  2017  . BUNIONECTOMY    . [place], 2001  . COLONOSCOPY  2019   . MASTECTOMY Bilateral 04/2019  . TONSILLECTOMY AND ADENOIDECTOMY    . WISDOM TOOTH EXTRACTION     Family History  Problem Relation Age of Onset  . Lung cancer Mother   . Alzheimer's disease Maternal Grandmother   . Heart attack Maternal Grandfather    Social History   Social History Narrative   Marital status/children/pets: married. 2 living children (1 son (twin) deceased)   Education/employment: B.S. Engineer, site.        Allergies as of 02/11/2020      Reactions   Diphenhydramine Hcl Other (See Comments)   Paradoxical reaction.  "Hyper"      Medication List       Accurate as of February 11, 2020  1:04 PM. If you have any questions, ask your nurse or doctor.        buPROPion 300 MG 24 hr tablet Commonly known as: WELLBUTRIN XL TAKE 1 TABLET BY MOUTH EVERY DAY   carvedilol 25 MG tablet Commonly known as: COREG Take by mouth 2 (two) times daily.   FLUoxetine 20 MG tablet Commonly known as: PROZAC Take 2 tablets (40 mg total) by mouth daily.   furosemide 20 MG tablet Commonly known as: LASIX Take by mouth.   letrozole 2.5 MG tablet Commonly known as: FEMARA Take by mouth.   leuprolide 11.25 MG Kit injection Commonly known as: LUPRON Inject into the muscle.   oxybutynin 5 MG tablet Commonly known as: DITROPAN Take 5 mg by mouth 2 (two) times daily.   SUMAtriptan 100 MG tablet Commonly known as: IMITREX Take by mouth.   valACYclovir 1000 MG tablet Commonly known as: VALTREX   valsartan 160 MG tablet Commonly known as: DIOVAN       All past medical history, surgical history, allergies, family history, immunizations andmedications were updated in the EMR today and reviewed under the history and medication portions of their EMR.    No results found for this or any previous visit (from the past 2160 hour(s)).  Patient was never admitted.   ROS: 14 pt review of systems performed and negative (unless mentioned in an HPI)  Objective: BP  102/70 (BP Location: Left Arm, Patient Position: Sitting, Cuff Size: Large)   Pulse 80   Temp 98 F (36.7 C) (Temporal)   Resp 16   Ht _0  (1.549 m)   Wt 142 lb (64.4 kg)   SpO2 99%   BMI 26.83 kg/m  Gen: Afebrile. No acute distress.  HENT: AT. [place].  Eyes:Pupils Equal Round Reactive to light, Extraocular movements intact,  Conjunctiva without redness, discharge or icterus. CV: RRR, no murmur Chest: CTAB.  Neuro:  Normal gait. PERLA. EOMi. Alert. Oriented.  Psych: mild psychomotor agitation, otherwise Normal affect, dress and demeanor. Normal speech. Normal thought content and judgment.  Assessment/plan: Claire Dolores is a 49 y.o. female present for\ f/u Essential hypertension/heart failure Stable.  Current regimen valsartan 160 mg daily, Lasix 20 mg daily, Coreg 25 mg twice daily.  Blood pressure within normal limits today.  No signs of fluid overload.  Follows with [facility]ology at [facility] prescribes her medications.  Malignant neoplasm of upper-outer quadrant of right breast in female, estrogen receptor positive ([place]) Reports use of oxybutynin, Lupron and Femara-prescribed for her oncologist.  She has routine follow-ups with her specialist. Follows routinely w/ onc.  Depression, recurrent (HCC)/Grief reaction/anxiety/sleep disturbance Patient's anxiety and sleep disturbance still not well controlled.  Initially responded to the addition of Prozac, but she feels this was not helpful any longer. Patient with increased depression/anxiety after the loss of 1 of her sons.  She is also been battling breast cancer for the past 3 years. -She and her family are in therapy and receiving great benefit from those sessions.  Highly encouraged to continue attending sessions. -Continue Wellbutrin 300 mg daily.  This has been working for her up until the most recent tragic event. -Taper off Prozac by decreasing to Prozac 20 mg (1 tab) daily for 2 weeks and then can discontinue. -Start Seroquel  taper nightly.  Seroquel 50 mg nightly x3 days if needed may increase by half tab every 3 nights.  If needing greater than Seroquel 100 mg nightly, call in for further tapering instructions at that time. -Prior meds: Trazodone-made her jittery - could consider Effexor in place of Wellbutrin.  She may even get benefit from hot flashes from her medication with that medication. Follow-up in 3-1/2 weeks with provider   Return in about 4 weeks (around 03/10/2020) for follow up virtual. . No orders of the defined types were placed in this encounter.  Meds ordered this encounter  Medications  . QUEtiapine (SEROQUEL) 50 MG tablet    Sig: 1 tab QHS for 3 days, then increase to 2 tabs nightly if needed.    Dispense:  60 tablet    Refill:  1  . buPROPion (WELLBUTRIN XL) 300 MG 24 hr tablet    Sig: Take 1 tablet (300 mg total) by mouth daily.    Dispense:  90 tablet    Refill:  1   Referral Orders  No referral(s) requested today     Note is dictated utilizing voice recognition software. Although note has been proof read prior to signing, occasional typographical errors still can be missed. If any questions arise, please do not hesitate to call for verification.  Electronically signed by: Howard Pouch, DO [place]

## 2020-02-11 NOTE — Patient Instructions (Addendum)
Continue Wellbutrin.  Decrease prozac 1 tab a day.  Start seroquel 1 tab before bed. After 3 days if not working well enough, but not making you groggy, increase to 2 tabs before bed.   If feeling well after 2 weeks, can discontinue prozac all together.    Major Depressive Disorder, Adult Major depressive disorder (MDD) is a mental health condition. MDD often makes you feel sad, hopeless, or helpless. MDD can also cause symptoms in your body. MDD can affect your:  Work.  School.  Relationships.  Other normal activities. MDD can range from mild to very bad. It may occur once (single episode MDD). It can also occur many times (recurrent MDD). The main symptoms of MDD often include:  Feeling sad, depressed, or irritable most of the time.  Loss of interest. MDD symptoms also include:  Sleeping too much or too little.  Eating too much or too little.  A change in your weight.  Feeling tired (fatigue) or having low energy.  Feeling worthless.  Feeling guilty.  Trouble making decisions.  Trouble thinking clearly.  Thoughts of suicide or harming others.  Feeling weak.  Feeling agitated.  Keeping yourself from being around other people (isolation). Follow these instructions at home: Activity  Do these things as told by your doctor: ? Go back to your normal activities. ? Exercise regularly. ? Spend time outdoors. Alcohol  Talk with your doctor about how alcohol can affect your antidepressant medicines.  Do not drink alcohol. Or, limit how much alcohol you drink. ? This means no more than 1 drink a day for nonpregnant women and 2 drinks a day for men. One drink equals one of these:  12 oz of beer.  5 oz of wine.  1 oz of hard liquor. General instructions  Take over-the-counter and prescription medicines only as told by your doctor.  Eat a healthy diet.  Get plenty of sleep.  Find activities that you enjoy. Make time to do them.  Think about joining a  support group. Your doctor may be able to suggest a group for you.  Keep all follow-up visits as told by your doctor. This is important. Where to find more information:  [employer] on Mental Illness: ? www.nami.[place]: ? https://carter.com/  National Suicide Prevention Lifeline: ? (938)433-4671. This is free, 24-hour help. Contact a doctor if:  Your symptoms get worse.  You have new symptoms. Get help right away if:  You self-harm.  You see, hear, taste, smell, or feel things that are not present (hallucinate). If you ever feel like you may hurt yourself or others, or have thoughts about taking your own life, get help right away. You can go to your nearest emergency department or call:  Your local emergency services (911 in the [place]  A suicide crisis helpline, such as the National Suicide Prevention Lifeline: ? 5048781881. This is open 24 hours a day. This information is not intended to replace advice given to you by your health care provider. Make sure you discuss any questions you have with your health care provider. Document Revised: 09/19/2017 Document Reviewed: 06/23/2016 Elsevier Patient Education  2020 [employer].

## 2020-02-22 DIAGNOSIS — F4321 Adjustment disorder with depressed mood: Secondary | ICD-10-CM | POA: Diagnosis not present

## 2020-02-28 ENCOUNTER — Telehealth: Payer: Self-pay

## 2020-02-28 NOTE — Telephone Encounter (Signed)
Patient called in wanting to speak with nurse about medication. complaining medication is making her extremely sleepy wants to know what she can do. Patient did not leave medication name     please call and advise

## 2020-02-28 NOTE — Telephone Encounter (Signed)
Pt was called and VM was left to return call  °

## 2020-02-28 NOTE — Telephone Encounter (Signed)
Pt returned call and said she is taking one tablet at night around 9:30pm and she is groggy until around 1pm the next day. She said it is helping her sleep great but she cannot deal with the groggy feeling she is having during the day. Pt said the tablets are not scored. She is now off the prozac and successfully weaned off that. Please advise.

## 2020-02-28 NOTE — Telephone Encounter (Signed)
She can take a 1/2 tab of seroquel and try to take earlier if able as well.

## 2020-02-28 NOTE — Telephone Encounter (Signed)
Pt was called and given instructions, she verbalized understanding

## 2020-03-06 DIAGNOSIS — Z170 Estrogen receptor positive status [ER+]: Secondary | ICD-10-CM | POA: Diagnosis not present

## 2020-03-06 DIAGNOSIS — I5022 Chronic systolic (congestive) heart failure: Secondary | ICD-10-CM | POA: Diagnosis not present

## 2020-03-06 DIAGNOSIS — C50411 Malignant neoplasm of upper-outer quadrant of right female breast: Secondary | ICD-10-CM | POA: Diagnosis not present

## 2020-03-06 DIAGNOSIS — Z6827 Body mass index (BMI) 27.0-27.9, adult: Secondary | ICD-10-CM | POA: Diagnosis not present

## 2020-03-06 DIAGNOSIS — I509 Heart failure, unspecified: Secondary | ICD-10-CM | POA: Diagnosis not present

## 2020-03-06 DIAGNOSIS — R002 Palpitations: Secondary | ICD-10-CM | POA: Diagnosis not present

## 2020-03-06 DIAGNOSIS — R5383 Other fatigue: Secondary | ICD-10-CM | POA: Diagnosis not present

## 2020-03-08 ENCOUNTER — Other Ambulatory Visit: Payer: Self-pay | Admitting: Family Medicine

## 2020-03-17 ENCOUNTER — Telehealth: Payer: Self-pay | Admitting: Family Medicine

## 2020-03-17 NOTE — Telephone Encounter (Signed)
Recv'd records from [facility] forwarded 13 pages to Dr. Howard Pouch 5/28/21fbg

## 2020-03-21 DIAGNOSIS — F4321 Adjustment disorder with depressed mood: Secondary | ICD-10-CM | POA: Diagnosis not present

## 2020-03-24 DIAGNOSIS — R42 Dizziness and giddiness: Secondary | ICD-10-CM | POA: Diagnosis not present

## 2020-03-30 ENCOUNTER — Telehealth: Payer: Self-pay

## 2020-03-30 NOTE — Telephone Encounter (Signed)
Requested for an appt to see Dr. Raoul Pitch because of headaches that she has had for over the past week and a half.  She stated that she has been a couple of other places and was not getting any relief and would like to see if Dr. Raoul Pitch could resolve them for her.  Please advise. [phone]

## 2020-03-31 NOTE — Telephone Encounter (Signed)
Pt was called and went to cardiologist and oncologist and had MRI completed, this was normal. Headache for almost 2 weeks. Pt advised if symptoms worsen to go to ED over the weekend, she agreed. Scheduled appt.

## 2020-04-03 ENCOUNTER — Ambulatory Visit: Payer: BC Managed Care – PPO | Admitting: Family Medicine

## 2020-04-03 DIAGNOSIS — Z5111 Encounter for antineoplastic chemotherapy: Secondary | ICD-10-CM | POA: Diagnosis not present

## 2020-04-03 DIAGNOSIS — Z170 Estrogen receptor positive status [ER+]: Secondary | ICD-10-CM | POA: Diagnosis not present

## 2020-04-03 DIAGNOSIS — C50411 Malignant neoplasm of upper-outer quadrant of right female breast: Secondary | ICD-10-CM | POA: Diagnosis not present

## 2020-04-04 ENCOUNTER — Encounter: Payer: Self-pay | Admitting: Family Medicine

## 2020-04-04 ENCOUNTER — Ambulatory Visit: Payer: BC Managed Care – PPO | Admitting: Family Medicine

## 2020-04-04 ENCOUNTER — Other Ambulatory Visit: Payer: Self-pay

## 2020-04-04 VITALS — BP 113/79 | HR 88 | Temp 97.7°F | Resp 18 | Ht 61.0 in | Wt 146.4 lb

## 2020-04-04 DIAGNOSIS — G43809 Other migraine, not intractable, without status migrainosus: Secondary | ICD-10-CM | POA: Diagnosis not present

## 2020-04-04 MED ORDER — SUMATRIPTAN SUCCINATE 100 MG PO TABS: 100.0000 mg | ORAL_TABLET | Freq: Once | ORAL | 5 refills | Status: DC

## 2020-04-04 NOTE — Progress Notes (Signed)
This visit occurred during the SARS-CoV-2 public health emergency.  Safety protocols were in place, including screening questions prior to the visit, additional usage of staff PPE, and extensive cleaning of exam room while observing appropriate contact time as indicated for disinfecting solutions.    Carmen Shannon , 31-Jan-1971, 49 y.o., female MRN: [id number] Patient Care Team    Relationship Specialty Notifications Start End  Ma Hillock, DO PCP - General Family Medicine  05/25/19   Mollie Germany, MD Referring Physician Hematology and Oncology  05/25/19   Johnston Ebbs, [place]  Physician Assistant  05/25/19   Draeger, Stevphen Rochester, MD Referring Physician Orthopedic Surgery  05/25/19     Chief Complaint  Patient presents with  . Migraine    Pt has had headache for 2 weeks. It is better today. Pt saw oncology and cardiology about headache.      Subjective: Pt presents for an OV with complaints of migraines of 10 days duration.  Associated symptoms include fall x2, dizziness and vomit x1.She endorses a long standing h/o migraines. Usually not lasting 10 days in duration though. She does endorse it has improved today. She use to take imitrex for her headaches, but she was out of her medication. She has a h/o breast cancer and is on chemo. She saw her onc which did perform an MRI of the brain which was normal and reassuring on 03/24/2020.   Depression screen Carmen Shannon 2/9 02/11/2020 08/17/2019 05/25/2019  Decreased Interest '1 1 3  ' Down, Depressed, Hopeless '3 3 3  ' PHQ - 2 Score '4 4 6  ' Altered sleeping '2 3 3  ' Tired, decreased energy '3 3 3  ' Change in appetite 0 0 3  Feeling bad or failure about yourself  3 0 3  Trouble concentrating '1 1 3  ' Moving slowly or fidgety/restless 0 0 3  Suicidal thoughts 0 0 0  PHQ-9 Score '13 11 24  ' Difficult doing work/chores Very difficult Somewhat difficult Very difficult    Allergies  Allergen Reactions  . Diphenhydramine Hcl Other (See Comments)    Paradoxical  reaction.  "Hyper"   Social History   Social History Narrative   Marital status/children/pets: married. 2 living children (1 son (twin) deceased)   Education/employment: B.S. Engineer, site.       Past Medical History:  Diagnosis Date  . Breast cancer ([place])   . CHF (congestive heart failure) ([place]) 2017   From Chemo  . Chicken pox   . Depression   . Migraines   . Pleural effusion 02/05/2017  . Right thyroid nodule 05/29/2016   Past Surgical History:  Procedure Laterality Date  . BREAST BIOPSY  2017  . BUNIONECTOMY    . [place], 2001  . COLONOSCOPY  2019  . MASTECTOMY Bilateral 04/2019  . TONSILLECTOMY AND ADENOIDECTOMY    . WISDOM TOOTH EXTRACTION     Family History  Problem Relation Age of Onset  . Lung cancer Mother   . Alzheimer's disease Maternal Grandmother   . Heart attack Maternal Grandfather    Allergies as of 04/04/2020      Reactions   Diphenhydramine Hcl Other (See Comments)   Paradoxical reaction.  "Hyper"      Medication List       Accurate as of April 04, 2020  8:08 AM. If you have any questions, ask your nurse or doctor.        buPROPion 300 MG 24 hr tablet  Commonly known as: WELLBUTRIN XL Take 1 tablet (300 mg total) by mouth daily.   carvedilol 25 MG tablet Commonly known as: COREG Take by mouth 2 (two) times daily.   furosemide 20 MG tablet Commonly known as: LASIX Take by mouth.   letrozole 2.5 MG tablet Commonly known as: FEMARA Take by mouth.   leuprolide 11.25 MG Kit injection Commonly known as: LUPRON Inject into the muscle.   oxybutynin 5 MG tablet Commonly known as: DITROPAN Take 5 mg by mouth 2 (two) times daily.   QUEtiapine 50 MG tablet Commonly known as: SEROquel 1 tab QHS for 3 days, then increase to 2 tabs nightly if needed.   SUMAtriptan 100 MG tablet Commonly known as: IMITREX Take by mouth.   valACYclovir 1000 MG tablet Commonly known as: VALTREX   valsartan 160 MG tablet Commonly  known as: DIOVAN       All past medical history, surgical history, allergies, family history, immunizations andmedications were updated in the EMR today and reviewed under the history and medication portions of their EMR.     ROS: Negative, with the exception of above mentioned in HPI   Objective:  BP 113/79 (BP Location: Left Arm, Patient Position: Sitting, Cuff Size: Normal)   Pulse 88   Temp 97.7 F (36.5 C) (Temporal)   Resp 18   Ht '5\' 1"'  (1.549 m)   Wt 146 lb 6 oz (66.4 kg)   SpO2 98%   BMI 27.66 kg/m  Body mass index is 27.66 kg/m. Gen: Afebrile. No acute distress. Nontoxic in appearance, well developed, well nourished.  HENT: AT. [place].  Eyes:Pupils Equal Round Reactive to light, Extraocular movements intact,  Conjunctiva without redness, discharge or icterus. CV: RRR  Chest: CTAB, no wheeze or crackles. Good air movement, normal resp effort.   Neuro:  Normal gait. PERLA. EOMi. Alert. Oriented x3  Psych: Normal affect, dress and demeanor. Normal speech. Normal thought content and judgment.  No exam data present No results found. No results found for this or any previous visit (from the past 24 hour(s)).  Assessment/Plan: Carmen Shannon is a 49 y.o. female present for OV for  Other migraine without status migrainosus, not intractable - Imitrex refilled for her today - discussed headache log and if having more frequent headaches a month we could consider preventive medication.  - MRI by Onc reviewed and reassuring.  - currently headache free.  - she endorses better sleep pattern- despite not being able to  Tolerate the seroquel at lowest dose d/t sleep hangover. We discussed today if needing additional sleep coverage the next step would be ambien or like medication. Pt would like to wait for now.    Reviewed expectations re: course of current medical issues.  Discussed self-management of symptoms.  Outlined signs and symptoms indicating need for more acute  intervention.  Patient verbalized understanding and all questions were answered.  Patient received an After-Visit Summary.    No orders of the defined types were placed in this encounter.  No orders of the defined types were placed in this encounter.  Referral Orders  No referral(s) requested today     Note is dictated utilizing voice recognition software. Although note has been proof read prior to signing, occasional typographical errors still can be missed. If any questions arise, please do not hesitate to call for verification.   electronically signed by:  Howard Pouch, DO  [place]

## 2020-04-04 NOTE — Patient Instructions (Signed)
I have refilled your Imitrex. If you are having more frequent headaches per month please be seen we can try a prophylactic medication.

## 2020-05-02 DIAGNOSIS — Z170 Estrogen receptor positive status [ER+]: Secondary | ICD-10-CM | POA: Diagnosis not present

## 2020-05-02 DIAGNOSIS — C50411 Malignant neoplasm of upper-outer quadrant of right female breast: Secondary | ICD-10-CM | POA: Diagnosis not present

## 2020-05-09 DIAGNOSIS — F4321 Adjustment disorder with depressed mood: Secondary | ICD-10-CM | POA: Diagnosis not present

## 2020-05-10 ENCOUNTER — Other Ambulatory Visit: Payer: Self-pay | Admitting: Family Medicine

## 2020-05-30 DIAGNOSIS — C50411 Malignant neoplasm of upper-outer quadrant of right female breast: Secondary | ICD-10-CM | POA: Diagnosis not present

## 2020-05-30 DIAGNOSIS — Z170 Estrogen receptor positive status [ER+]: Secondary | ICD-10-CM | POA: Diagnosis not present

## 2020-06-16 DIAGNOSIS — L821 Other seborrheic keratosis: Secondary | ICD-10-CM | POA: Diagnosis not present

## 2020-06-16 DIAGNOSIS — L578 Other skin changes due to chronic exposure to nonionizing radiation: Secondary | ICD-10-CM | POA: Diagnosis not present

## 2020-06-16 DIAGNOSIS — L918 Other hypertrophic disorders of the skin: Secondary | ICD-10-CM | POA: Diagnosis not present

## 2020-06-16 DIAGNOSIS — D2210 Melanocytic nevi of unspecified eyelid, including canthus: Secondary | ICD-10-CM | POA: Diagnosis not present

## 2020-06-27 DIAGNOSIS — C50411 Malignant neoplasm of upper-outer quadrant of right female breast: Secondary | ICD-10-CM | POA: Diagnosis not present

## 2020-06-27 DIAGNOSIS — R1084 Generalized abdominal pain: Secondary | ICD-10-CM | POA: Diagnosis not present

## 2020-06-27 DIAGNOSIS — R110 Nausea: Secondary | ICD-10-CM | POA: Diagnosis not present

## 2020-06-27 DIAGNOSIS — I427 Cardiomyopathy due to drug and external agent: Secondary | ICD-10-CM | POA: Diagnosis not present

## 2020-06-27 DIAGNOSIS — Z170 Estrogen receptor positive status [ER+]: Secondary | ICD-10-CM | POA: Diagnosis not present

## 2020-06-27 DIAGNOSIS — Z5181 Encounter for therapeutic drug level monitoring: Secondary | ICD-10-CM | POA: Diagnosis not present

## 2020-06-27 DIAGNOSIS — F3289 Other specified depressive episodes: Secondary | ICD-10-CM | POA: Diagnosis not present

## 2020-06-27 LAB — COMPREHENSIVE METABOLIC PANEL: Calcium: 9.8 (ref 8.7–10.7)

## 2020-06-27 LAB — BASIC METABOLIC PANEL
BUN: 15 (ref 4–21)
CO2: 28 — AB (ref 13–22)
Chloride: 103 (ref 99–108)
Creatinine: 1 (ref 0.5–1.1)
Glucose: 203
Potassium: 3.8 (ref 3.4–5.3)
Sodium: 139 (ref 137–147)

## 2020-06-27 LAB — CBC: RBC: 4.24 (ref 3.87–5.11)

## 2020-06-27 LAB — CBC AND DIFFERENTIAL
HCT: 39 (ref 36–46)
Hemoglobin: 13.6 (ref 12.0–16.0)
Platelets: 286 (ref 150–399)
WBC: 5.4

## 2020-06-27 LAB — TSH: TSH: 1.29 (ref 0.41–5.90)

## 2020-06-27 LAB — HEPATIC FUNCTION PANEL
ALT: 30 (ref 7–35)
AST: 20 (ref 13–35)
Alkaline Phosphatase: 120 (ref 25–125)
Bilirubin, Total: 0.3

## 2020-06-27 LAB — POCT INR: INR: 1 (ref 0.9–1.1)

## 2020-07-04 DIAGNOSIS — K429 Umbilical hernia without obstruction or gangrene: Secondary | ICD-10-CM | POA: Diagnosis not present

## 2020-07-04 DIAGNOSIS — R109 Unspecified abdominal pain: Secondary | ICD-10-CM | POA: Diagnosis not present

## 2020-07-04 DIAGNOSIS — R935 Abnormal findings on diagnostic imaging of other abdominal regions, including retroperitoneum: Secondary | ICD-10-CM | POA: Diagnosis not present

## 2020-07-20 ENCOUNTER — Other Ambulatory Visit: Payer: Self-pay | Admitting: Physician Assistant

## 2020-07-20 DIAGNOSIS — K625 Hemorrhage of anus and rectum: Secondary | ICD-10-CM | POA: Diagnosis not present

## 2020-07-20 DIAGNOSIS — A09 Infectious gastroenteritis and colitis, unspecified: Secondary | ICD-10-CM | POA: Diagnosis not present

## 2020-07-20 DIAGNOSIS — R110 Nausea: Secondary | ICD-10-CM | POA: Diagnosis not present

## 2020-07-20 DIAGNOSIS — R1013 Epigastric pain: Secondary | ICD-10-CM

## 2020-07-20 DIAGNOSIS — R197 Diarrhea, unspecified: Secondary | ICD-10-CM | POA: Diagnosis not present

## 2020-07-25 DIAGNOSIS — C50411 Malignant neoplasm of upper-outer quadrant of right female breast: Secondary | ICD-10-CM | POA: Diagnosis not present

## 2020-07-25 DIAGNOSIS — Z170 Estrogen receptor positive status [ER+]: Secondary | ICD-10-CM | POA: Diagnosis not present

## 2020-07-28 ENCOUNTER — Ambulatory Visit
Admission: RE | Admit: 2020-07-28 | Discharge: 2020-07-28 | Disposition: A | Discharge status: Home / Self Care | Payer: BC Managed Care – PPO | Source: Ambulatory Visit | Attending: Physician Assistant | Admitting: Physician Assistant

## 2020-07-28 DIAGNOSIS — R110 Nausea: Secondary | ICD-10-CM

## 2020-07-28 DIAGNOSIS — R1013 Epigastric pain: Secondary | ICD-10-CM | POA: Diagnosis not present

## 2020-08-03 ENCOUNTER — Other Ambulatory Visit: Payer: Self-pay | Admitting: Family Medicine

## 2020-08-22 DIAGNOSIS — Z170 Estrogen receptor positive status [ER+]: Secondary | ICD-10-CM | POA: Diagnosis not present

## 2020-08-22 DIAGNOSIS — C50411 Malignant neoplasm of upper-outer quadrant of right female breast: Secondary | ICD-10-CM | POA: Diagnosis not present

## 2020-09-03 ENCOUNTER — Other Ambulatory Visit: Payer: Self-pay | Admitting: Family Medicine

## 2020-09-05 DIAGNOSIS — Z6827 Body mass index (BMI) 27.0-27.9, adult: Secondary | ICD-10-CM | POA: Diagnosis not present

## 2020-09-05 DIAGNOSIS — I428 Other cardiomyopathies: Secondary | ICD-10-CM | POA: Diagnosis not present

## 2020-09-05 DIAGNOSIS — I5022 Chronic systolic (congestive) heart failure: Secondary | ICD-10-CM | POA: Diagnosis not present

## 2020-09-19 DIAGNOSIS — Z170 Estrogen receptor positive status [ER+]: Secondary | ICD-10-CM | POA: Diagnosis not present

## 2020-09-19 DIAGNOSIS — Z79899 Other long term (current) drug therapy: Secondary | ICD-10-CM | POA: Diagnosis not present

## 2020-09-19 DIAGNOSIS — C50411 Malignant neoplasm of upper-outer quadrant of right female breast: Secondary | ICD-10-CM | POA: Diagnosis not present

## 2020-10-16 DIAGNOSIS — Z1159 Encounter for screening for other viral diseases: Secondary | ICD-10-CM | POA: Diagnosis not present

## 2020-10-18 DIAGNOSIS — R1013 Epigastric pain: Secondary | ICD-10-CM | POA: Diagnosis not present

## 2020-10-18 DIAGNOSIS — K2930 Chronic superficial gastritis without bleeding: Secondary | ICD-10-CM | POA: Diagnosis not present

## 2020-11-09 ENCOUNTER — Telehealth: Payer: Self-pay | Admitting: Family Medicine

## 2020-11-09 NOTE — Telephone Encounter (Signed)
Patient requesting refill of Wellbutrin. Please send to same [facility] in [place].

## 2020-11-09 NOTE — Telephone Encounter (Signed)
Pt sched for appt 1/21

## 2020-11-10 ENCOUNTER — Ambulatory Visit: Payer: 59 | Admitting: Family Medicine

## 2020-11-10 ENCOUNTER — Encounter: Payer: Self-pay | Admitting: Family Medicine

## 2020-11-10 ENCOUNTER — Other Ambulatory Visit: Payer: Self-pay

## 2020-11-10 VITALS — BP 127/76 | HR 101 | Temp 98.6°F | Ht 61.0 in | Wt 149.0 lb

## 2020-11-10 DIAGNOSIS — K2930 Chronic superficial gastritis without bleeding: Secondary | ICD-10-CM | POA: Insufficient documentation

## 2020-11-10 DIAGNOSIS — C50411 Malignant neoplasm of upper-outer quadrant of right female breast: Secondary | ICD-10-CM | POA: Diagnosis not present

## 2020-11-10 DIAGNOSIS — T451X5A Adverse effect of antineoplastic and immunosuppressive drugs, initial encounter: Secondary | ICD-10-CM

## 2020-11-10 DIAGNOSIS — F339 Major depressive disorder, recurrent, unspecified: Secondary | ICD-10-CM

## 2020-11-10 DIAGNOSIS — F419 Anxiety disorder, unspecified: Secondary | ICD-10-CM

## 2020-11-10 DIAGNOSIS — I5022 Chronic systolic (congestive) heart failure: Secondary | ICD-10-CM | POA: Diagnosis not present

## 2020-11-10 DIAGNOSIS — I10 Essential (primary) hypertension: Secondary | ICD-10-CM

## 2020-11-10 DIAGNOSIS — F4321 Adjustment disorder with depressed mood: Secondary | ICD-10-CM

## 2020-11-10 DIAGNOSIS — Z713 Dietary counseling and surveillance: Secondary | ICD-10-CM

## 2020-11-10 DIAGNOSIS — Z170 Estrogen receptor positive status [ER+]: Secondary | ICD-10-CM

## 2020-11-10 DIAGNOSIS — D701 Agranulocytosis secondary to cancer chemotherapy: Secondary | ICD-10-CM

## 2020-11-10 MED ORDER — BUPROPION HCL ER (XL) 300 MG PO TB24
300.0000 mg | ORAL_TABLET | Freq: Every day | ORAL | 1 refills | Status: DC
Start: 2020-11-10 — End: 2021-07-04

## 2020-11-10 MED ORDER — HYDROXYZINE HCL 10 MG PO TABS: 10.0000 mg | ORAL_TABLET | Freq: Two times a day (BID) | ORAL | 5 refills | Status: DC | PRN

## 2020-11-10 MED ORDER — SUMATRIPTAN SUCCINATE 100 MG PO TABS: 100.0000 mg | ORAL_TABLET | Freq: Once | ORAL | 5 refills | Status: DC

## 2020-11-10 NOTE — Progress Notes (Signed)
Patient ID: Carmen Shannon, female  DOB: [birth year], 50 y.o.   MRN: [id number] Patient Care Team    Relationship Specialty Notifications Start End  Ma Hillock, DO PCP - General Family Medicine  05/25/19   Mollie Germany, MD Referring Physician Hematology and Oncology  05/25/19   Johnston Ebbs, [place]  Physician Assistant  05/25/19   Draeger, Stevphen Rochester, MD Referring Physician Orthopedic Surgery  05/25/19     Chief Complaint  Patient presents with   Follow-up    [facility]; pt is not fasting    Subjective: Carmen Shannon is a 50 y.o.  female present for f/u [facility] Migraine: Pt reports she is having 6-7 migraines a month.  She does feel the migraines are partially stress related.  Her associated symptoms with migraines or dizziness, nausea and sometimes vomiting.She endorses a long standing h/o migraines.  She use to take imitrex for her headaches and it is helpful.  She has a h/o breast cancer and is on chemo. She saw her onc which did perform an MRI of the brain which was normal and reassuring on 03/24/2020  Depression with anxiety:  Patient presents today to discuss her depression and anxiety.  She reports overall her depression is manageable on Wellbutrin 300 mg daily.  She reports there are some days she could have a little extra help but overall she is managing and does not want to change her regimen at this time.   Prior note:  Patient reports she is doing rather well with the Wellbutrin 300 mg daily and the add on fluoxetine 20 mg daily.  She still feels she may need some additional coverage, but has noticed a great deal of improvement.  She tried the trazodone to help her with sleep, but she felt it made her feel off and jittery.  She reports now that she has been on the fluoxetine she has found she is sleeping much better and does not need the trazodone anyway.  She reports she still has times where she feels overwhelmed with sadness over losing her son, but the majority of the days she is  coping. Prior note: Patient has a history of depression, that started in 2017.  She reports when she was diagnosed with breast cancer she was started on Wellbutrin to help with depression.  She reports her dose of 300 mg daily has been stable overall until recently.  2 weeks ago, 1 of her twin sons had passed away.  His twin currently still lives at home with her, but is ready to start college.  she also has a daughter that lives in [place].  She reports her family has started counseling together and are getting great benefit from the sessions.  She does endorse increased depression and anxiety today.  She states she does not sleep well at all.  Essential hypertension/heart failure Current regimen valsartan 160 mg daily, Lasix 20 mg daily, Coreg 25 mg twice daily.  Blood pressure within normal limits today.  No signs of fluid overload.  Follows with cardiology at [facility] prescribes her medications.  Depression screen [facility] 2/9 02/11/2020 08/17/2019 05/25/2019  Decreased Interest '1 1 3  ' Down, Depressed, Hopeless '3 3 3  ' PHQ - 2 Score '4 4 6  ' Altered sleeping '2 3 3  ' Tired, decreased energy '3 3 3  ' Change in appetite 0 0 3  Feeling bad or failure about yourself  3 0 3  Trouble concentrating '1 1 3  ' Moving slowly or  fidgety/restless 0 0 3  Suicidal thoughts 0 0 0  PHQ-9 Score '13 11 24  ' Difficult doing work/chores Very difficult Somewhat difficult Very difficult   GAD 7 : Generalized Anxiety Score 05/25/2019  Nervous, Anxious, on Edge 3  Control/stop worrying 3  Worry too much - different things 3  Trouble relaxing 3  Restless 3  Easily annoyed or irritable 3  Afraid - awful might happen 3  Total GAD 7 Score 21  Anxiety Difficulty Very difficult       No flowsheet data found.  Immunization History  Administered Date(s) Administered   Hepatitis A 04/28/2017, 01/26/2018   Influenza Inj Mdck Quad Pf 08/05/2019   Influenza,inj,Quad PF,6+ Mos 08/05/2017   Influenza-Unspecified  07/21/2020   Moderna Sars-Covid-2 Vaccination 12/27/2019, 01/20/2020   PFIZER(Purple Top)SARS-COV-2 Vaccination 10/15/2020   Tdap 04/28/2017    No exam data present  Past Medical History:  Diagnosis Date   Breast cancer ([place])    CHF (congestive heart failure) ([place]) 2017   From Chemo   Chicken pox    Depression    Migraines    Pleural effusion 02/05/2017   Right thyroid nodule 05/29/2016   Allergies  Allergen Reactions   Diphenhydramine Hcl Other (See Comments)    Paradoxical reaction.  "Hyper"   Past Surgical History:  Procedure Laterality Date   BREAST BIOPSY  2017   BUNIONECTOMY     CESAREAN SECTION     1997, 2001   COLONOSCOPY  2019   MASTECTOMY Bilateral 04/2019   TONSILLECTOMY AND ADENOIDECTOMY     WISDOM TOOTH EXTRACTION     Family History  Problem Relation Age of Onset   Lung cancer Mother    Alzheimer's disease Maternal Grandmother    Heart attack Maternal Grandfather    Social History   Social History Narrative   Marital status/children/pets: married. 2 living children (1 son (twin) deceased)   Education/employment: B.S. Engineer, site.        Allergies as of 11/10/2020      Reactions   Diphenhydramine Hcl Other (See Comments)   Paradoxical reaction.  "Hyper"      Medication List       Accurate as of November 10, 2020  5:32 PM. If you have any questions, ask your nurse or doctor.        buPROPion 300 MG 24 hr tablet Commonly known as: WELLBUTRIN XL Take 1 tablet (300 mg total) by mouth daily.   carvedilol 25 MG tablet Commonly known as: COREG Take by mouth 2 (two) times daily.   furosemide 20 MG tablet Commonly known as: LASIX Take by mouth as needed.   hydrOXYzine 10 MG tablet Commonly known as: ATARAX/VISTARIL Take 1-3 tablets (10-30 mg total) by mouth 2 (two) times daily as needed. Started by: Howard Pouch, DO   letrozole 2.5 MG tablet Commonly known as: Coram Take by mouth.   leuprolide 11.25 MG Kit  injection Commonly known as: LUPRON Inject into the muscle.   oxybutynin 5 MG tablet Commonly known as: DITROPAN Take 5 mg by mouth 2 (two) times daily.   pantoprazole 40 MG tablet Commonly known as: PROTONIX Take 40 mg by mouth daily.   SUMAtriptan 100 MG tablet Commonly known as: IMITREX Take 1 tablet (100 mg total) by mouth once for 1 dose.   valACYclovir 1000 MG tablet Commonly known as: VALTREX   valsartan 160 MG tablet Commonly known as: DIOVAN       All past medical history, surgical history, allergies,  family history, immunizations andmedications were updated in the EMR today and reviewed under the history and medication portions of their EMR.    No results found for this or any previous visit (from the past 2160 hour(s)).  Patient was never admitted.   ROS: 14 pt review of systems performed and negative (unless mentioned in an HPI)  Objective: BP 127/76    Pulse (!) 101    Temp 98.6 F (37 C) (Oral)    Ht '5\' 1"'  (1.549 m)    Wt 149 lb (67.6 kg)    SpO2 100%    BMI 28.15 kg/m  Gen: Afebrile. No acute distress.  HENT: AT. [place]. No cough or shortness of breath.  Eyes:Pupils Equal Round Reactive to light, Extraocular movements intact,  Conjunctiva without redness, discharge or icterus. CV: RRR no murmur, no edema Chest: CTAB, no wheeze or crackles Neuro:  Normal gait. PERLA. EOMi. Alert. Oriented x3 Psych: Normal affect, dress and demeanor. Normal speech. Normal thought content and judgment.   Assessment/plan: Carmen Shannon is a 50 y.o. female present for\ f/u Essential hypertension/heart failure Stable Current regimen valsartan 160 mg daily, Lasix 20 mg daily, Coreg 25 mg twice daily.  No signs of fluid overload.   Follows with cardiology at [facility] prescribes her medications.  Malignant neoplasm of upper-outer quadrant of right breast in female, estrogen receptor positive ([place]) She has routine follow-ups with her specialist- which prescribe ditropan,  lupron, femara Follows routinely w/ onc.  Depression, recurrent (HCC)/Grief reaction/anxiety/sleep disturbance - stable.  -She and her family are in therapy and receiving great benefit from those sessions.  Highly encouraged to continue attending sessions. -continue Wellbutrin 300 mg daily.  -tried/failed: prozac. seroquel (sedation) and trazodone(jittery).  -Prior meds: Trazodone-made her jittery - could consider Effexor in place of Wellbutrin.  She may even get benefit from hot flashes from her medication with that medication. - f/u 5.5 mos.   Other migraine without status migrainosus, not intractable - stable.  -Continue Imitrex -Encouraged her to start a B complex vitamin and low-dose magnesium daily - discussed headache log and if having more frequent headaches a month we could consider preventive medication.  - MRI by Onc reviewed and reassuring.    Return in about 5 months (around 04/26/2021) for CMC (30 min). No orders of the defined types were placed in this encounter.  Meds ordered this encounter  Medications   SUMAtriptan (IMITREX) 100 MG tablet    Sig: Take 1 tablet (100 mg total) by mouth once for 1 dose.    Dispense:  10 tablet    Refill:  5   buPROPion (WELLBUTRIN XL) 300 MG 24 hr tablet    Sig: Take 1 tablet (300 mg total) by mouth daily.    Dispense:  90 tablet    Refill:  1    MUST HAVE OV FOR FURTHER REFILLS   hydrOXYzine (ATARAX/VISTARIL) 10 MG tablet    Sig: Take 1-3 tablets (10-30 mg total) by mouth 2 (two) times daily as needed.    Dispense:  90 tablet    Refill:  5   Referral Orders  No referral(s) requested today     Note is dictated utilizing voice recognition software. Although note has been proof read prior to signing, occasional typographical errors still can be missed. If any questions arise, please do not hesitate to call for verification.  Electronically signed by: Howard Pouch, DO [place]

## 2020-11-10 NOTE — Patient Instructions (Addendum)
Start B-complex and magnesium 250-450mg  a day. These can help decrease headaches.   https://www.calculator.net/calorie-calculator.html>>> daily calorie allowance. Recheck every week.   Water at least 80-100 ounces a day.   myfitness pal app to track calories, water and progress.

## 2020-11-23 ENCOUNTER — Other Ambulatory Visit: Payer: Self-pay | Admitting: Family Medicine

## 2021-05-22 ENCOUNTER — Telehealth (INDEPENDENT_AMBULATORY_CARE_PROVIDER_SITE_OTHER): Payer: 59 | Admitting: Family Medicine

## 2021-05-22 ENCOUNTER — Encounter: Payer: Self-pay | Admitting: Family Medicine

## 2021-05-22 DIAGNOSIS — U071 COVID-19: Secondary | ICD-10-CM | POA: Insufficient documentation

## 2021-05-22 DIAGNOSIS — J029 Acute pharyngitis, unspecified: Secondary | ICD-10-CM

## 2021-05-22 HISTORY — DX: COVID-19: U07.1

## 2021-05-22 MED ORDER — MOLNUPIRAVIR EUA 200MG CAPSULE: 4.0000 | ORAL_CAPSULE | Freq: Two times a day (BID) | ORAL | 0 refills | Status: AC

## 2021-05-22 MED ORDER — PREDNISONE 20 MG PO TABS: 40.0000 mg | ORAL_TABLET | Freq: Every day | ORAL | 0 refills | Status: DC

## 2021-05-22 MED ORDER — BENZONATATE 200 MG PO CAPS: 200.0000 mg | ORAL_CAPSULE | Freq: Two times a day (BID) | ORAL | 0 refills | Status: DC | PRN

## 2021-05-22 NOTE — Patient Instructions (Signed)
10 Things You Can Do to Manage Your COVID-19 Symptoms at Home If you have possible or confirmed COVID-19 Stay home except to get medical care. Monitor your symptoms carefully. If your symptoms get worse, call your healthcare provider immediately. Get rest and stay hydrated. If you have a medical appointment, call the healthcare provider ahead of time and tell them that you have or may have COVID-19. For medical emergencies, call 911 and notify the dispatch personnel that you have or may have COVID-19. Cover your cough and sneezes with a tissue or use the inside of your elbow. Wash your hands often with soap and water for at least 20 seconds or clean your hands with an alcohol-based hand sanitizer that contains at least 60% alcohol. As much as possible, stay in a specific room and away from other people in your home. Also, you should use a separate bathroom, if available. If you need to be around other people in or outside of the home, wear a mask. Avoid sharing personal items with other people in your household, like dishes, towels, and bedding. Clean all surfaces that are touched often, like counters, tabletops, and doorknobs. Use household cleaning sprays or wipes according to the label instructions. cdc.gov/coronavirus 05/05/2020 This information is not intended to replace advice given to you by your health care provider. Make sure you discuss any questions you have with your healthcare provider. Document Revised: 11/24/2020 Document Reviewed: 11/24/2020 Elsevier Patient Education  2022 Elsevier Inc.  

## 2021-05-22 NOTE — Progress Notes (Signed)
VIRTUAL VISIT VIA VIDEO  I connected with Naraly Yilmaz on 05/22/21 at 11:30 AM EDT by elemedicine application and verified that I am speaking with the correct person using two identifiers. Location patient: Home Location provider: [facility], Office Persons participating in the virtual visit: Patient, Dr. Raoul Pitch and Darnell Level. Cesar, CMA  I discussed the limitations of evaluation and management by telemedicine and the availability of in person appointments. The patient expressed understanding and agreed to proceed.   SUBJECTIVE Chief Complaint  Patient presents with   Covid Positive    Pt c/o sore throat, cough, nasal congestion, HA, fatigue body aches, hoarseness x 2 ays; COVID pos today     HPI: Carmen Shannon is a 50 y.o. present for covid illness.  Exposure: exposure Friday/.Saturday. Her symptoms started Sunday with sore throat, cough, headache, aches, cough  and nasal congestion.  Sx start: 2 days ago.  Vaccine:pfizer x2 Pt denies fever, chills, nausea or shortness of breath Otc: delsym cough and advil.  GFR: >60 Anticoag: n/a   ROS: See pertinent positives and negatives per HPI.  Patient Active Problem List   Diagnosis Date Noted   Chronic superficial gastritis without bleeding 11/10/2020   Anxiety 02/11/2020   Hypertension 05/25/2019   Depression, recurrent ([place]) 05/25/2019   Grief reaction 05/25/2019   Heart failure ([place]) 02/05/2017   Chemotherapy induced neutropenia ([place]) 07/02/2016   Malignant neoplasm of upper-outer quadrant of right breast in female, estrogen receptor positive ([place]) 03/28/2016    Social History   Tobacco Use   Smoking status: Never   Smokeless tobacco: Never  Substance Use Topics   Alcohol use: Yes    Comment: occasionally     Current Outpatient Medications:    benzonatate (TESSALON) 200 MG capsule, Take 1 capsule (200 mg total) by mouth 2 (two) times daily as needed for cough., Disp: 20 capsule, Rfl: 0   buPROPion (WELLBUTRIN  XL) 300 MG 24 hr tablet, Take 1 tablet (300 mg total) by mouth daily., Disp: 90 tablet, Rfl: 1   carvedilol (COREG) 25 MG tablet, Take by mouth 2 (two) times daily., Disp: , Rfl:    furosemide (LASIX) 20 MG tablet, Take by mouth as needed., Disp: , Rfl:    letrozole (FEMARA) 2.5 MG tablet, Take by mouth., Disp: , Rfl:    leuprolide (LUPRON) 11.25 MG KIT injection, Inject into the muscle., Disp: , Rfl:    molnupiravir EUA 200 mg CAPS, Take 4 capsules (800 mg total) by mouth 2 (two) times daily for 5 days., Disp: 40 capsule, Rfl: 0   oxybutynin (DITROPAN) 5 MG tablet, Take 5 mg by mouth 2 (two) times daily., Disp: , Rfl:    predniSONE (DELTASONE) 20 MG tablet, Take 2 tablets (40 mg total) by mouth daily with breakfast., Disp: 10 tablet, Rfl: 0   SUMAtriptan (IMITREX) 100 MG tablet, Take 1 tablet (100 mg total) by mouth once for 1 dose., Disp: 10 tablet, Rfl: 5  Allergies  Allergen Reactions   Diphenhydramine Hcl Other (See Comments)    Paradoxical reaction.  "Hyper"    OBJECTIVE: There were no vitals taken for this visit. Gen: No acute distress. Nontoxic in appearance.  HENT: AT. [place].  MMM.  Eyes:Pupils Equal Round Reactive to light, Extraocular movements intact,  Conjunctiva without redness, discharge or icterus. Chest: Cough not present. No shortness of breath Neuro: Alert. Oriented x3  Psych: Normal affect and demeanor. Normal speech. Normal thought content and judgment.  ASSESSMENT AND PLAN: Carmen Shannon is a  50 y.o. female present for  COVID-19/Sore throat Rest, hydrate.  +/- flonase, mucinex (DM if cough), nettie pot or nasal saline. Prednisone x5 days & molnupiravir prescribed, take until completed.  Tesslaon perles for cough Reviewed home care instructions for COVID. Advised self-isolation at home for at least 5 days. After 5 days, if improved and fever resolved, can be in public, but should wear a mask around others for an additional 5 days. If symptoms, esp, dyspnea  develops/worsens, recommend in-person evaluation at either an urgent care or the emergency room.  Howard Pouch, DO 05/22/2021   No follow-ups on file.  No orders of the defined types were placed in this encounter.  Meds ordered this encounter  Medications   benzonatate (TESSALON) 200 MG capsule    Sig: Take 1 capsule (200 mg total) by mouth 2 (two) times daily as needed for cough.    Dispense:  20 capsule    Refill:  0   predniSONE (DELTASONE) 20 MG tablet    Sig: Take 2 tablets (40 mg total) by mouth daily with breakfast.    Dispense:  10 tablet    Refill:  0   molnupiravir EUA 200 mg CAPS    Sig: Take 4 capsules (800 mg total) by mouth 2 (two) times daily for 5 days.    Dispense:  40 capsule    Refill:  0   Referral Orders  No referral(s) requested today

## 2021-06-09 ENCOUNTER — Other Ambulatory Visit: Payer: Self-pay | Admitting: Family Medicine

## 2021-07-04 ENCOUNTER — Telehealth: Payer: Self-pay

## 2021-07-04 ENCOUNTER — Other Ambulatory Visit: Payer: Self-pay

## 2021-07-04 MED ORDER — BUPROPION HCL ER (XL) 300 MG PO TB24: 300.0000 mg | ORAL_TABLET | Freq: Every day | ORAL | 0 refills | Status: DC

## 2021-07-04 NOTE — Telephone Encounter (Signed)
Rx sent for 30 d/s and pt scheduled

## 2021-07-04 NOTE — Telephone Encounter (Signed)
  Encourage patient to contact the pharmacy for refills or they can request refills through [place]: 05/22/21  NEXT APPOINTMENT DATE:  MEDICATION: buPROPion (WELLBUTRIN XL) 300 MG 24 hr tablet  Is the patient out of medication? Yes   PHARMACY: [id number] [place], [place] - [address]  Let patient know to contact pharmacy at the end of the day to make sure medication is ready.  Please notify patient to allow 48-72 hours to process

## 2021-07-07 IMAGING — US US ABDOMEN LIMITED
1 series · 14 of 25 positions shown · non-contrast
Comparison: None.

CLINICAL DATA: Epigastric region pain.  History of breast carcinoma

EXAM:
ULTRASOUND ABDOMEN LIMITED RIGHT UPPER QUADRANT

[Series 1: us abdomen limited · 0.20mm/px · 14 of 41 slices shown]
[im 1/41]
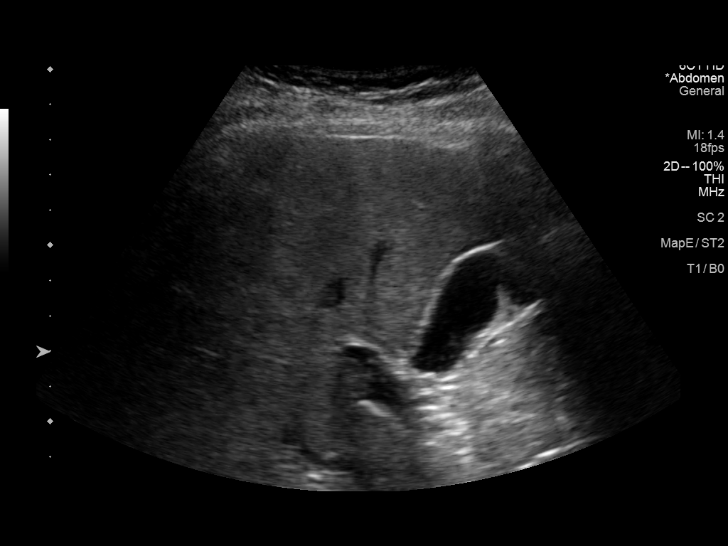
[im 4/41]
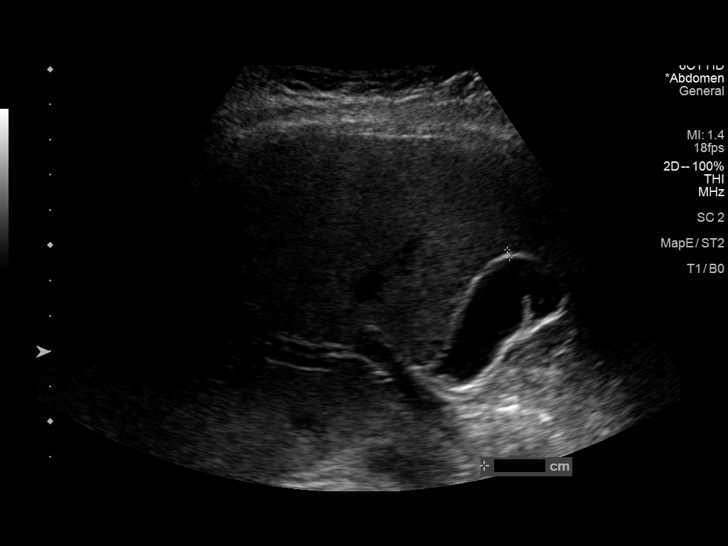
[im 7/41]
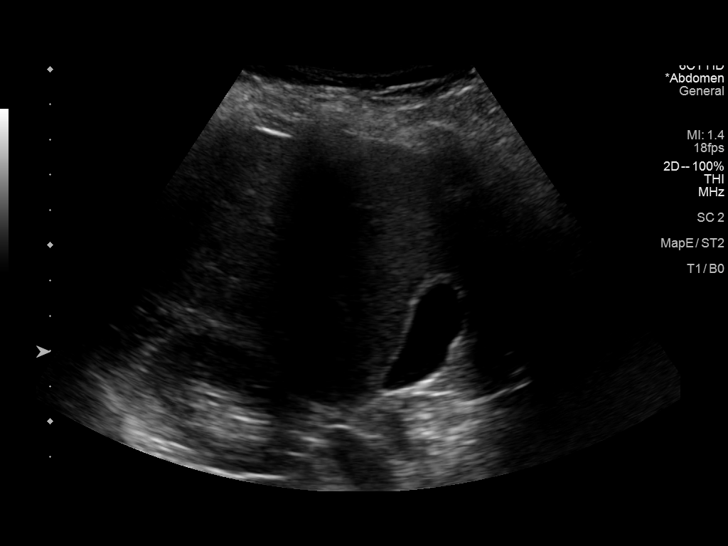
[im 11/41]
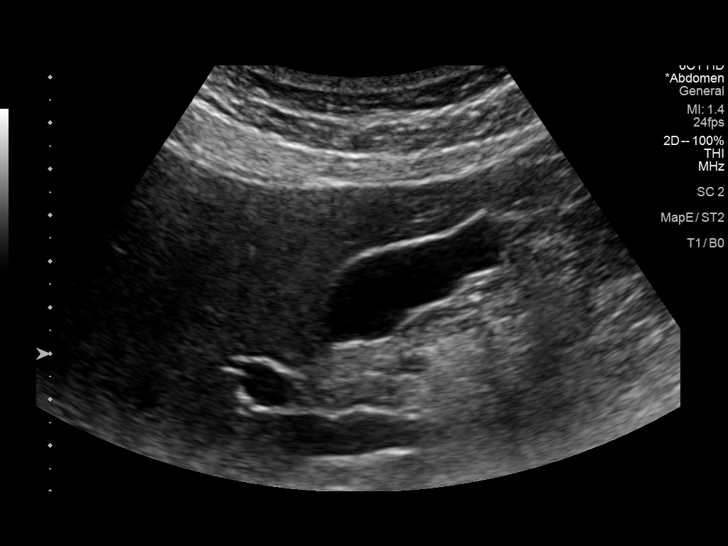
[im 14/41]
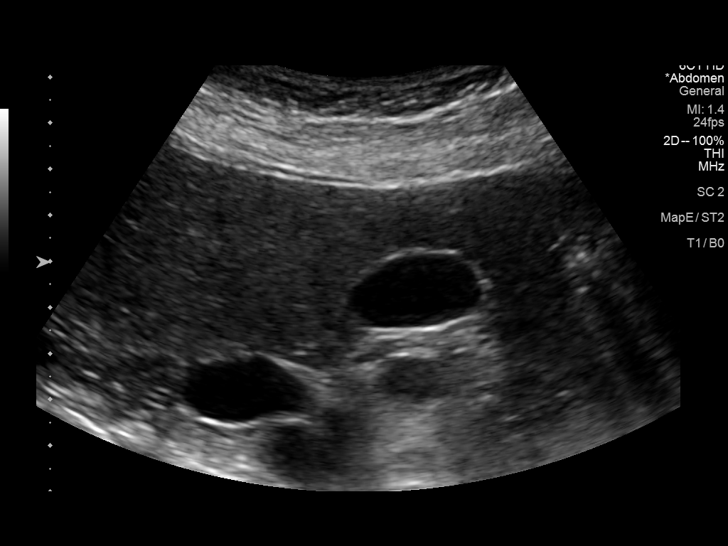
[im 16/41]
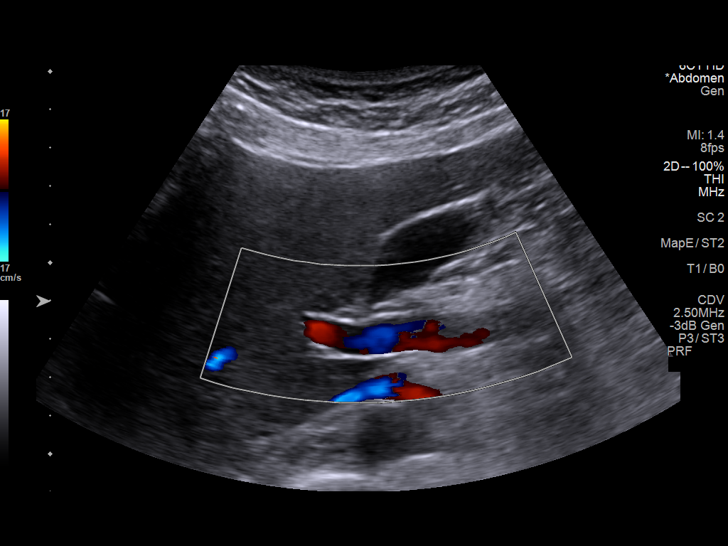
[im 19/41]
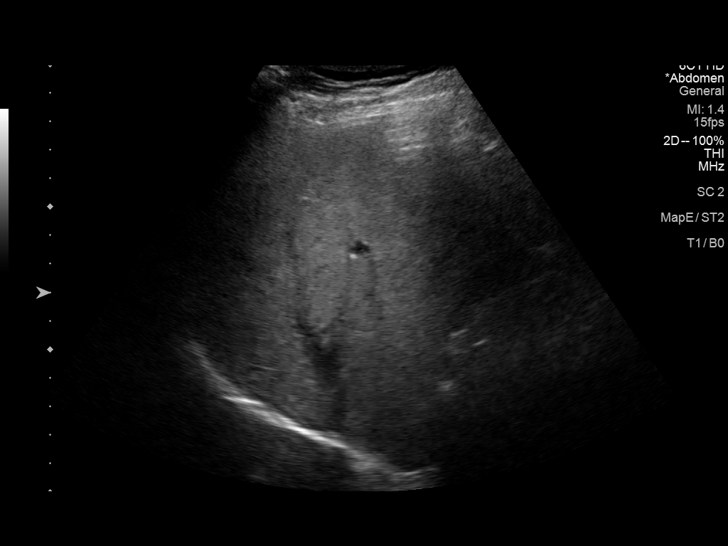
[im 22/41]
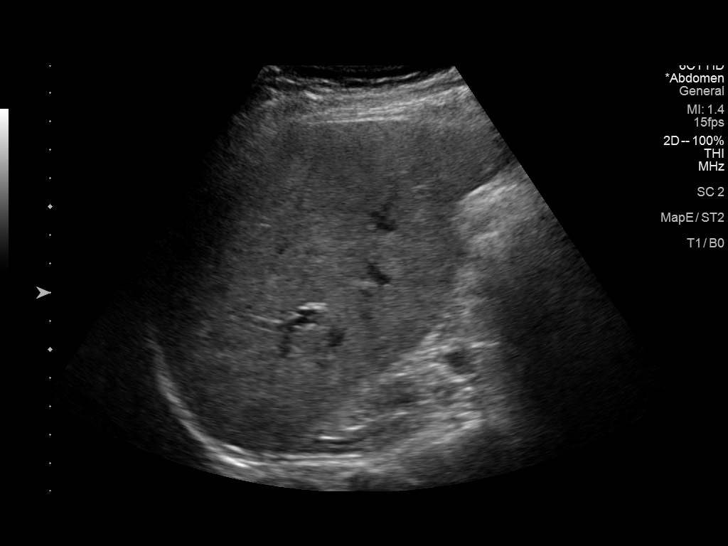
[im 26/41]
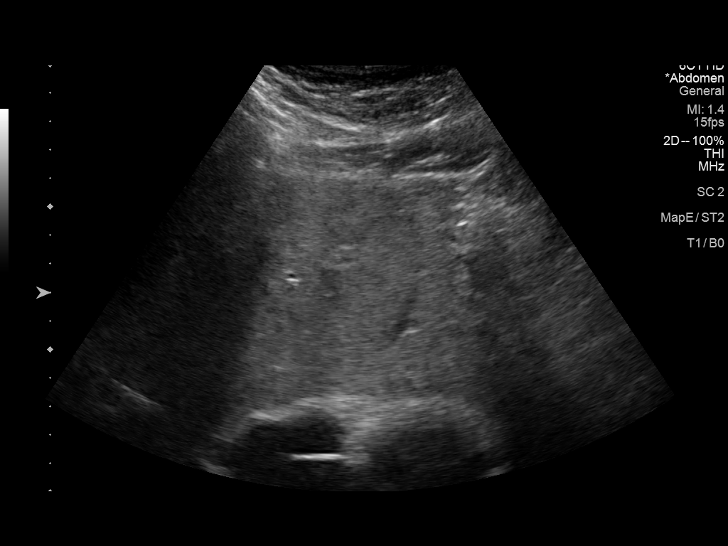
[im 27/41]
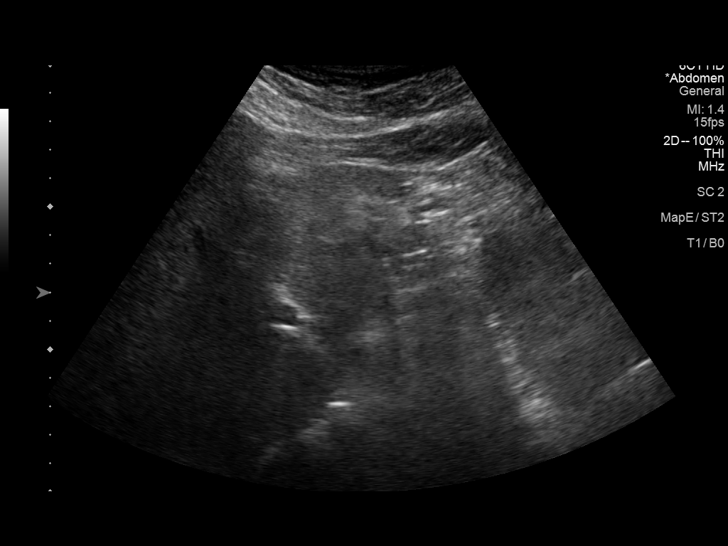
[im 31/41]
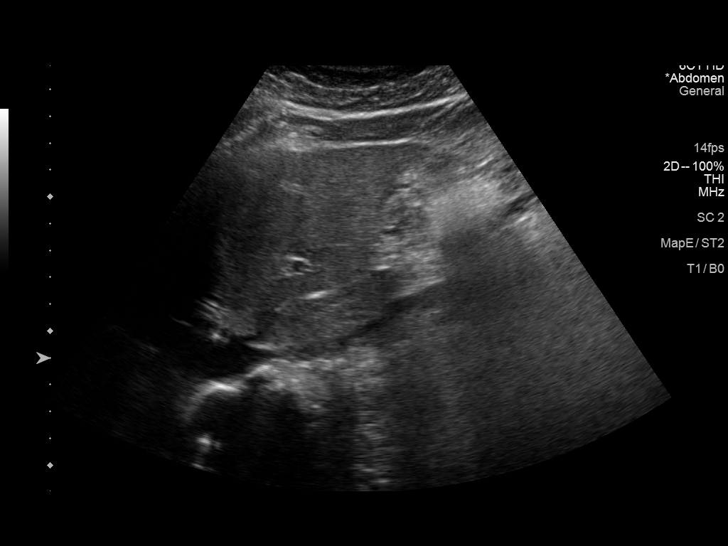
[im 34/41]
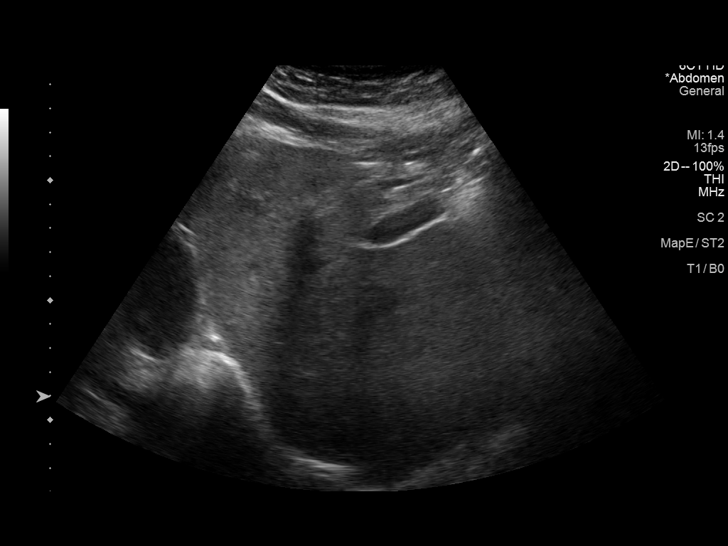
[im 37/41]
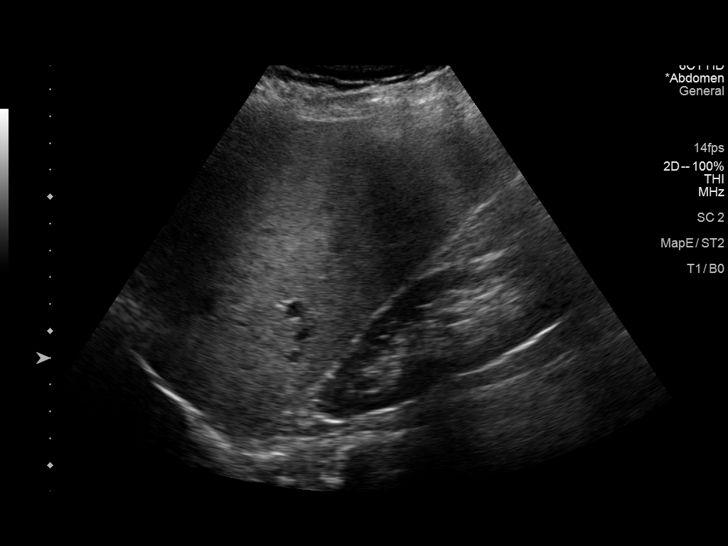
[im 41/41]
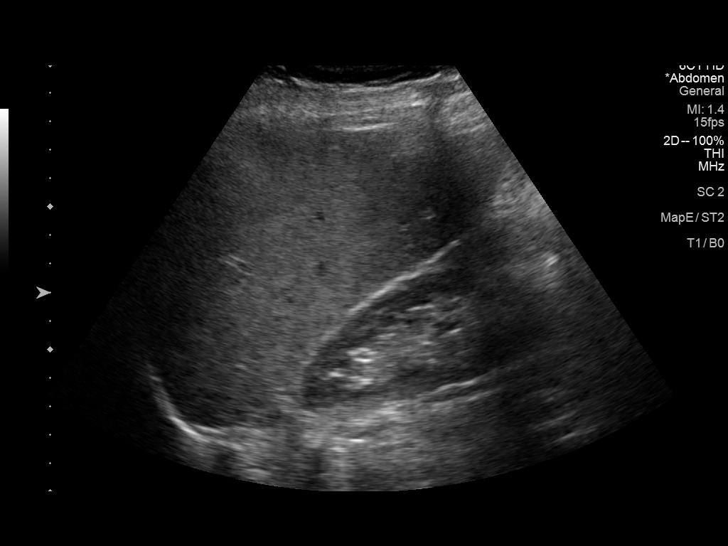

[14 of 25 positions shown; findings below may reference images not displayed]

FINDINGS: Gallbladder:

No gallstones or wall thickening visualized. There is no
pericholecystic fluid. No sonographic Murphy sign noted by
sonographer.

Common bile duct:

Diameter: 2 mm. No intrahepatic or extrahepatic biliary duct
dilatation.

Liver:

No focal lesion identified. Within normal limits in parenchymal
echogenicity. Portal vein is patent on color Doppler imaging with
normal direction of blood flow towards the liver.

Other: None.
IMPRESSION: Study within normal limits.

## 2021-07-11 ENCOUNTER — Other Ambulatory Visit: Payer: Self-pay

## 2021-07-11 ENCOUNTER — Encounter: Payer: Self-pay | Admitting: Family Medicine

## 2021-07-11 ENCOUNTER — Ambulatory Visit (INDEPENDENT_AMBULATORY_CARE_PROVIDER_SITE_OTHER): Payer: 59 | Admitting: Family Medicine

## 2021-07-11 VITALS — BP 120/84 | HR 88 | Temp 98.0°F | Wt 147.8 lb

## 2021-07-11 DIAGNOSIS — I10 Essential (primary) hypertension: Secondary | ICD-10-CM | POA: Diagnosis not present

## 2021-07-11 DIAGNOSIS — C50411 Malignant neoplasm of upper-outer quadrant of right female breast: Secondary | ICD-10-CM

## 2021-07-11 DIAGNOSIS — F330 Major depressive disorder, recurrent, mild: Secondary | ICD-10-CM

## 2021-07-11 DIAGNOSIS — F4321 Adjustment disorder with depressed mood: Secondary | ICD-10-CM

## 2021-07-11 DIAGNOSIS — F4320 Adjustment disorder, unspecified: Secondary | ICD-10-CM

## 2021-07-11 DIAGNOSIS — Z170 Estrogen receptor positive status [ER+]: Secondary | ICD-10-CM

## 2021-07-11 DIAGNOSIS — I5022 Chronic systolic (congestive) heart failure: Secondary | ICD-10-CM

## 2021-07-11 DIAGNOSIS — F419 Anxiety disorder, unspecified: Secondary | ICD-10-CM

## 2021-07-11 LAB — TSH: TSH: 1.7 u[IU]/mL (ref 0.35–5.50)

## 2021-07-11 MED ORDER — BUPROPION HCL ER (XL) 300 MG PO TB24: 300.0000 mg | ORAL_TABLET | Freq: Every day | ORAL | 1 refills | Status: DC

## 2021-07-11 MED ORDER — SUMATRIPTAN SUCCINATE 100 MG PO TABS: 100.0000 mg | ORAL_TABLET | Freq: Once | ORAL | 5 refills | Status: DC

## 2021-07-11 MED ORDER — AMITRIPTYLINE HCL 10 MG PO TABS: 10.0000 mg | ORAL_TABLET | Freq: Every day | ORAL | 5 refills | Status: DC

## 2021-07-11 NOTE — Progress Notes (Signed)
Patient ID: Carmen Shannon, female  DOB: [birth year], 50 y.o.   MRN: [id number] Patient Care Team    Relationship Specialty Notifications Start End  Carmen Hillock, DO PCP - General Family Medicine  05/25/19   Carmen Germany, MD Referring Physician Hematology and Oncology  05/25/19   Carmen Shannon, [place]  Physician Assistant  05/25/19   Draeger, Carmen Rochester, MD Referring Physician Orthopedic Surgery  05/25/19     Chief Complaint  Patient presents with   Follow-up    [facility]/med refill. Pt is not fasting.    Subjective: Carmen Shannon is a 50 y.o.  female present for f/u [facility] Migraine: Pt reports she is having 4-6 migraines a month, but she has noticed more "regular" headaches.  She does feel the headaches  are partially stress related.  Her associated symptoms with migraines or dizziness, nausea and sometimes vomiting.She endorses a long standing h/o migraines.  She use to take imitrex for her headaches and it is helpful.  She has a h/o breast cancer and is on chemo. She saw her onc which did perform an MRI of the brain which was normal and reassuring on 03/24/2020  Depression with anxiety:  Patient presents today to discuss her depression and anxiety.  She reports overall her depression is doing ok on Wellbutrin 300 mg daily.  She has days she misses her son a great deal. She is still struggling with guilt. Today she is open to adding on a medication.  Prior note:  Patient reports she is doing rather well with the Wellbutrin 300 mg daily and the add on fluoxetine 20 mg daily.  She still feels she may need some additional coverage, but has noticed a great deal of improvement.  She tried the trazodone to help her with sleep, but she felt it made her feel off and jittery.  She reports now that she has been on the fluoxetine she has found she is sleeping much better and does not need the trazodone anyway.  She reports she still has times where she feels overwhelmed with sadness over losing her son, but  the majority of the days she is coping. Prior note: Patient has a history of depression, that started in 2017.  She reports when she was diagnosed with breast cancer she was started on Wellbutrin to help with depression.  She reports her dose of 300 mg daily has been stable overall until recently.  2 weeks ago, 1 of her twin sons had passed away.  His twin currently still lives at home with her, but is ready to start college.  she also has a daughter that lives in [place].  She reports her family has started counseling together and are getting great benefit from the sessions.  She does endorse increased depression and anxiety today.  She states she does not sleep well at all.  Essential hypertension/heart failure Current regimen valsartan 160 mg daily, Lasix 20 mg daily, Coreg 25 mg twice daily.    Follows with cardiology at [facility] prescribes her medications.  Depression screen [facility] 2/9 07/11/2021 05/22/2021 02/11/2020 08/17/2019 05/25/2019  Decreased Interest 1 0 '1 1 3  ' Down, Depressed, Hopeless 2 0 '3 3 3  ' PHQ - 2 Score 3 0 '4 4 6  ' Altered sleeping 2 0 '2 3 3  ' Tired, decreased energy 1 0 '3 3 3  ' Change in appetite 1 0 0 0 3  Feeling bad or failure about yourself  1 0 3 0 3  Trouble concentrating 0 0 '1 1 3  ' Moving slowly or fidgety/restless 0 0 0 0 3  Suicidal thoughts 0 0 0 0 0  PHQ-9 Score 8 0 '13 11 24  ' Difficult doing work/chores - - Very difficult Somewhat difficult Very difficult   GAD 7 : Generalized Anxiety Score 07/11/2021 05/25/2019  Nervous, Anxious, on Edge 2 3  Control/stop worrying 2 3  Worry too much - different things 2 3  Trouble relaxing 2 3  Restless 2 3  Easily annoyed or irritable 2 3  Afraid - awful might happen 2 3  Total GAD 7 Score 14 21  Anxiety Difficulty - Very difficult       No flowsheet data found.  Immunization History  Administered Date(s) Administered   Hepatitis A 04/28/2017, 01/26/2018   Influenza Inj Mdck Quad Pf 08/05/2019   Influenza,inj,Quad  PF,6+ Mos 08/05/2017   Influenza-Unspecified 07/21/2020   Moderna Sars-Covid-2 Vaccination 12/27/2019, 01/20/2020   PFIZER(Purple Top)SARS-COV-2 Vaccination 10/15/2020   Tdap 04/28/2017    No results found.  Past Medical History:  Diagnosis Date   Breast cancer ([place])    CHF (congestive heart failure) ([place]) 2017   From Chemo   Chicken pox    COVID-19 05/22/2021   Depression    Migraines    Pleural effusion 02/05/2017   Right thyroid nodule 05/29/2016   Allergies  Allergen Reactions   Diphenhydramine Hcl Other (See Comments)    Paradoxical reaction.  "Hyper"   Past Surgical History:  Procedure Laterality Date   BREAST BIOPSY  2017   BUNIONECTOMY     CESAREAN SECTION     1997, 2001   COLONOSCOPY  2019   MASTECTOMY Bilateral 04/2019   TONSILLECTOMY AND ADENOIDECTOMY     WISDOM TOOTH EXTRACTION     Family History  Problem Relation Age of Onset   Lung cancer Mother    Alzheimer's disease Maternal Grandmother    Heart attack Maternal Grandfather    Social History   Social History Narrative   Marital status/children/pets: married. 2 living children (1 son (twin) deceased)   Education/employment: B.S. Engineer, site.        Allergies as of 07/11/2021       Reactions   Diphenhydramine Hcl Other (See Comments)   Paradoxical reaction.  "Hyper"        Medication List        Accurate as of July 11, 2021  2:36 PM. If you have any questions, ask your nurse or doctor.          STOP taking these medications    benzonatate 200 MG capsule Commonly known as: TESSALON Stopped by: Howard Pouch, DO   predniSONE 20 MG tablet Commonly known as: DELTASONE Stopped by: Howard Pouch, DO       TAKE these medications    amitriptyline 10 MG tablet Commonly known as: ELAVIL Take 1-2 tablets (10-20 mg total) by mouth at bedtime. Started by: Howard Pouch, DO   buPROPion 300 MG 24 hr tablet Commonly known as: WELLBUTRIN XL Take 1 tablet (300 mg total) by  mouth daily.   carvedilol 25 MG tablet Commonly known as: COREG Take by mouth 2 (two) times daily.   furosemide 20 MG tablet Commonly known as: LASIX Take by mouth as needed.   letrozole 2.5 MG tablet Commonly known as: FEMARA Take by mouth.   leuprolide 11.25 MG Kit injection Commonly known as: LUPRON Inject into the muscle.   oxybutynin 5 MG tablet Commonly known as:  DITROPAN Take 5 mg by mouth 2 (two) times daily.   SUMAtriptan 100 MG tablet Commonly known as: IMITREX Take 1 tablet (100 mg total) by mouth once for 1 dose.        All past medical history, surgical history, allergies, family history, immunizations andmedications were updated in the EMR today and reviewed under the history and medication portions of their EMR.    No results found for this or any previous visit (from the past 2160 hour(s)).  Patient was never admitted.   ROS: 14 pt review of systems performed and negative (unless mentioned in an HPI)  Objective: BP 120/84   Pulse 88   Temp 98 F (36.7 C)   Wt 147 lb 12.8 oz (67 kg)   SpO2 98%   BMI 27.93 kg/m  Gen: Afebrile. No acute distress. Nontoxic, pleasant female.  HENT: AT. [place].  Eyes:Pupils Equal Round Reactive to light, Extraocular movements intact,  Conjunctiva without redness, discharge or icterus. CV: RRR no murmur, no edema, +2/4 P posterior tibialis pulses Chest: CTAB, no wheeze or crackles  Neuro: Normal gait. PERLA. EOMi. Alert. Oriented x3 Psych: Normal affect, dress and demeanor. Normal speech. Normal thought content and judgment..    Assessment/plan: Arli Bree is a 50 y.o. female present for\ f/u Essential hypertension/heart failure Stable. Euvolemic today.  Current regimen valsartan 160 mg daily, Lasix 20 mg daily PRN, Coreg 25 mg twice daily.   Cbc, cmp, tsh, ldl  Follows with cardiology at [facility] prescribes her medications.  Malignant neoplasm of upper-outer quadrant of right breast in female, estrogen  receptor positive ([place]) She has routine follow-ups with her specialist- which prescribe ditropan, lupron, femara Follows routinely w/ onc.  Depression, recurrent (HCC)/Grief reaction/anxiety/sleep disturbance - stable.  -She and her family are no longer attending therapy. Encouraged her to call her psychologist and ask if they could recommend a peer support group for her.  - start elavil 10-20 mg qhs (sleep, hdx, d/a) -continue Wellbutrin 300 mg daily.  -tried/failed: prozac. seroquel (sedation) and trazodone(jittery).  -Prior meds: Trazodone-made her jittery - could consider Effexor in place of Wellbutrin.  She may even get benefit from hot flashes from her medication with that medication. - f/u 5.5 mos.   Other migraine without status migrainosus, not intractable - Migraine stable. Tension headaches increased.  -continue Imitrex - started elavil - hopefully extra benefit of headache coverage. -Encouraged her to start a B complex vitamin and low-dose magnesium daily - discussed headache log and if having more frequent headaches a month we could consider preventive medication.  - MRI by Onc reviewed and reassuring.    Return in about 5 months (around 12/25/2021) for [facility] (30 min). Orders Placed This Encounter  Procedures   Comp Met (CMET)   TSH   CBC w/Diff   Direct LDL    Meds ordered this encounter  Medications   buPROPion (WELLBUTRIN XL) 300 MG 24 hr tablet    Sig: Take 1 tablet (300 mg total) by mouth daily.    Dispense:  90 tablet    Refill:  1   SUMAtriptan (IMITREX) 100 MG tablet    Sig: Take 1 tablet (100 mg total) by mouth once for 1 dose.    Dispense:  10 tablet    Refill:  5   amitriptyline (ELAVIL) 10 MG tablet    Sig: Take 1-2 tablets (10-20 mg total) by mouth at bedtime.    Dispense:  60 tablet    Refill:  5  Referral Orders  No referral(s) requested today     Note is dictated utilizing voice recognition software. Although note has been proof read  prior to signing, occasional typographical errors still can be missed. If any questions arise, please do not hesitate to call for verification.  Electronically signed by: Howard Pouch, DO [place]

## 2021-07-11 NOTE — Patient Instructions (Signed)
  Great to see you today.  I have refilled the medication(s) we provide.   If labs were collected, we will inform you of lab results once received either by echart message or telephone call.   - echart message- for normal results that have been seen by the patient already.   - telephone call: abnormal results or if patient has not viewed results in their echart.  Added elavil to regimen . Take about 1 hour before bed.

## 2021-07-12 ENCOUNTER — Telehealth: Payer: Self-pay | Admitting: Family Medicine

## 2021-07-12 DIAGNOSIS — E785 Hyperlipidemia, unspecified: Secondary | ICD-10-CM | POA: Insufficient documentation

## 2021-07-12 LAB — COMPREHENSIVE METABOLIC PANEL
ALT: 31 U/L (ref 0–35)
AST: 26 U/L (ref 0–37)
Albumin: 4.2 g/dL (ref 3.5–5.2)
Alkaline Phosphatase: 107 U/L (ref 39–117)
BUN: 16 mg/dL (ref 6–23)
CO2: 29 mEq/L (ref 19–32)
Calcium: 9.3 mg/dL (ref 8.4–10.5)
Chloride: 100 mEq/L (ref 96–112)
Creatinine, Ser: 0.94 mg/dL (ref 0.40–1.20)
GFR: 70.48 mL/min (ref >60.00)
Glucose, Bld: 133 mg/dL — ABNORMAL HIGH (ref 70–99)
Potassium: 3.5 mEq/L (ref 3.5–5.1)
Sodium: 139 mEq/L (ref 135–145)
Total Bilirubin: 0.4 mg/dL (ref 0.2–1.2)
Total Protein: 7.3 g/dL (ref 6.0–8.3)

## 2021-07-12 LAB — CBC WITH DIFFERENTIAL/PLATELET
Basophils Absolute: 0 10*3/uL (ref 0.0–0.1)
Basophils Relative: 1.3 % (ref 0.0–3.0)
Eosinophils Absolute: 0.1 10*3/uL (ref 0.0–0.7)
Eosinophils Relative: 2.7 % (ref 0.0–5.0)
HCT: 38.3 % (ref 36.0–46.0)
Hemoglobin: 13.2 g/dL (ref 12.0–15.0)
Lymphocytes Relative: 41.6 % (ref 12.0–46.0)
Lymphs Abs: 1.6 10*3/uL (ref 0.7–4.0)
MCHC: 34.6 g/dL (ref 30.0–36.0)
MCV: 92 fl (ref 78.0–100.0)
Monocytes Absolute: 0.4 10*3/uL (ref 0.1–1.0)
Monocytes Relative: 11.1 % (ref 3.0–12.0)
Neutro Abs: 1.7 10*3/uL (ref 1.4–7.7)
Neutrophils Relative %: 43.3 % (ref 43.0–77.0)
Platelets: 251 10*3/uL (ref 150.0–400.0)
RBC: 4.16 Mil/uL (ref 3.87–5.11)
RDW: 13.3 % (ref 11.5–15.5)
WBC: 3.9 10*3/uL — ABNORMAL LOW (ref 4.0–10.5)

## 2021-07-12 LAB — LDL CHOLESTEROL, DIRECT: Direct LDL: 174 mg/dL

## 2021-07-12 MED ORDER — ATORVASTATIN CALCIUM 20 MG PO TABS: 20.0000 mg | ORAL_TABLET | Freq: Every day | ORAL | 3 refills | Status: DC

## 2021-07-12 NOTE — Telephone Encounter (Signed)
Please call patient Carmen Shannon, Carmen Shannon Carmen Shannon function are normal Blood cell counts Carmen electrolytes are normal Your LDL/bad cholesterol is very high at 174.  With her cardiac history her LDL goal should be at least less than 100, Carmen less than 70 is best. This level of bad cholesterol does put her at increased cardiac risk Carmen I would recommend starting a medication called Lipitor to help provide her some cardiovascular protection Carmen decrease her LDL.  Recommendations: Routine exercise, at least 150 minutes a week  Low saturated/transfat diet.  Avoid fatty meats, limit red meat, butter Carmen fried foods.  If desiring to use butter, would recommend one of the plant-based butters.  If desiring to use oil, I would recommend olive oil. Start a fiber supplement twice daily, Benefiber/Metamucil/psyllium etc.  If she is agreeable to start medication, I have called in the atorvastatin 20 mg before bed.  We will need to follow-up with her in 8-12 weeks for repeat cholesterol panel Carmen we always check the Carmen Shannon enzymes when starting this new medication.  If she elects to start medication, please schedule her for follow-up in 8 to 12 weeks with provider.

## 2021-07-12 NOTE — Telephone Encounter (Signed)
LVM for pt to CB regarding results.  

## 2021-07-12 NOTE — Telephone Encounter (Signed)
Spoke with pt regarding labs and instructions.   

## 2021-08-06 ENCOUNTER — Other Ambulatory Visit: Payer: Self-pay | Admitting: Family Medicine

## 2021-12-03 ENCOUNTER — Other Ambulatory Visit: Payer: Self-pay | Admitting: Family Medicine

## 2021-12-14 ENCOUNTER — Ambulatory Visit (INDEPENDENT_AMBULATORY_CARE_PROVIDER_SITE_OTHER): Payer: 59 | Admitting: Family Medicine

## 2021-12-14 ENCOUNTER — Encounter: Payer: Self-pay | Admitting: Family Medicine

## 2021-12-14 ENCOUNTER — Other Ambulatory Visit: Payer: Self-pay

## 2021-12-14 VITALS — Ht 61.0 in | Wt 140.0 lb

## 2021-12-14 DIAGNOSIS — B349 Viral infection, unspecified: Secondary | ICD-10-CM

## 2021-12-14 DIAGNOSIS — R051 Acute cough: Secondary | ICD-10-CM

## 2021-12-14 MED ORDER — ONDANSETRON HCL 4 MG PO TABS: 4.0000 mg | ORAL_TABLET | Freq: Three times a day (TID) | ORAL | 0 refills | Status: DC | PRN

## 2021-12-14 MED ORDER — BENZONATATE 200 MG PO CAPS: 200.0000 mg | ORAL_CAPSULE | Freq: Two times a day (BID) | ORAL | 0 refills | Status: DC | PRN

## 2021-12-14 NOTE — Progress Notes (Addendum)
VIRTUAL VISIT VIA VIDEO  I connected with Carmen Shannon on 12/14/2021 at 10:30 AM EST by a video enabled telemedicine application and verified that I am speaking with the correct person using two identifiers. Location patient: Home Location provider: [facility], Office Persons participating in the virtual visit: Patient, Dr. Raoul Pitch and Cyndra Numbers, CMA  I discussed the limitations of evaluation and management by telemedicine and the availability of in person appointments. The patient expressed understanding and agreed to proceed.      Carmen Shannon , 05/20/1971, 51 y.o., female MRN: [id number] Patient Care Team    Relationship Specialty Notifications Start End  Ma Hillock, DO PCP - General Family Medicine  05/25/19   Mollie Germany, MD Referring Physician Hematology and Oncology  05/25/19   Johnston Ebbs, [place]  Physician Assistant  05/25/19   Draeger, Stevphen Rochester, MD Referring Physician Orthopedic Surgery  05/25/19     Chief Complaint  Patient presents with   Cough    Pt c/o dry cough, chest discomfort, diarrhea x 1 days; Neg Covid     Subjective: Pt presents for an OV with complaints of harsh  cough, with chest discomfort with cough and diarrhea x1 of 1-2 d  duration.  Associated symptoms include mild sore throat.  Pt has tried nothing to ease their symptoms.   Depression screen [facility] 2/9 07/11/2021 05/22/2021 02/11/2020 08/17/2019 05/25/2019  Decreased Interest 1 0 '1 1 3  ' Down, Depressed, Hopeless 2 0 '3 3 3  ' PHQ - 2 Score 3 0 '4 4 6  ' Altered sleeping 2 0 '2 3 3  ' Tired, decreased energy 1 0 '3 3 3  ' Change in appetite 1 0 0 0 3  Feeling bad or failure about yourself  1 0 3 0 3  Trouble concentrating 0 0 '1 1 3  ' Moving slowly or fidgety/restless 0 0 0 0 3  Suicidal thoughts 0 0 0 0 0  PHQ-9 Score 8 0 '13 11 24  ' Difficult doing work/chores - - Very difficult Somewhat difficult Very difficult    Allergies  Allergen Reactions   Diphenhydramine Hcl Other (See Comments)     Paradoxical reaction.  "Hyper"   Social History   Social History Narrative   Marital status/children/pets: married. 2 living children (1 son (twin) deceased)   Education/employment: B.S. Engineer, site.       Past Medical History:  Diagnosis Date   Breast cancer ([place])    CHF (congestive heart failure) ([place]) 2017   From Chemo   Chicken pox    COVID-19 05/22/2021   Depression    Migraines    Pleural effusion 02/05/2017   Right thyroid nodule 05/29/2016   Past Surgical History:  Procedure Laterality Date   BREAST BIOPSY  2017   BUNIONECTOMY     CESAREAN SECTION     1997, 2001   COLONOSCOPY  2019   MASTECTOMY Bilateral 04/2019   TONSILLECTOMY AND ADENOIDECTOMY     WISDOM TOOTH EXTRACTION     Family History  Problem Relation Age of Onset   Lung cancer Mother    Alzheimer's disease Maternal Grandmother    Heart attack Maternal Grandfather    Allergies as of 12/14/2021       Reactions   Diphenhydramine Hcl Other (See Comments)   Paradoxical reaction.  "Hyper"        Medication List        Accurate as of December 14, 2021 10:28 AM. If you  have any questions, ask your nurse or doctor.          amitriptyline 10 MG tablet Commonly known as: ELAVIL TAKE 1-2 TABLETS (10-20 MG TOTAL) BY MOUTH AT BEDTIME.   atorvastatin 20 MG tablet Commonly known as: LIPITOR Take 1 tablet (20 mg total) by mouth at bedtime.   buPROPion 300 MG 24 hr tablet Commonly known as: WELLBUTRIN XL Take 1 tablet (300 mg total) by mouth daily.   carvedilol 25 MG tablet Commonly known as: COREG Take by mouth 2 (two) times daily.   furosemide 20 MG tablet Commonly known as: LASIX Take by mouth as needed.   letrozole 2.5 MG tablet Commonly known as: FEMARA Take by mouth.   leuprolide 11.25 MG Kit injection Commonly known as: LUPRON Inject into the muscle.   losartan 25 MG tablet Commonly known as: COZAAR Take 25 mg by mouth at bedtime.   oxybutynin 5 MG tablet Commonly known  as: DITROPAN Take 5 mg by mouth 2 (two) times daily.   SUMAtriptan 100 MG tablet Commonly known as: IMITREX Take 1 tablet (100 mg total) by mouth once for 1 dose.        All past medical history, surgical history, allergies, family history, immunizations andmedications were updated in the EMR today and reviewed under the history and medication portions of their EMR.     Review of Systems  Constitutional:  Positive for malaise/fatigue. Negative for chills and fever.  HENT:  Positive for sore throat. Negative for ear pain and sinus pain.   Eyes:  Negative for discharge and redness.  Respiratory:  Positive for cough. Negative for sputum production, shortness of breath and wheezing.   Gastrointestinal:  Positive for diarrhea. Negative for nausea and vomiting.  Neurological:  Negative for dizziness and headaches.  Negative, with the exception of above mentioned in HPI   Objective:  Ht '5\' 1"'  (1.549 m)    Wt 140 lb (63.5 kg)    BMI 26.45 kg/m  Body mass index is 26.45 kg/m.  Physical Exam Vitals and nursing note reviewed.  Constitutional:      General: She is not in acute distress.    Appearance: Normal appearance. She is normal weight. She is not ill-appearing, toxic-appearing or diaphoretic.  Eyes:     Extraocular Movements: Extraocular movements intact.     Conjunctiva/sclera: Conjunctivae normal.     Pupils: Pupils are equal, round, and reactive to light.  Pulmonary:     Effort: Pulmonary effort is normal.     Breath sounds: Normal breath sounds.  Neurological:     Mental Status: She is alert and oriented to person, place, and time. Mental status is at baseline.  Psychiatric:        Mood and Affect: Mood normal.        Behavior: Behavior normal.        Thought Content: Thought content normal.        Judgment: Judgment normal.     No results found. No results found. No results found for this or any previous visit (from the past 24  hour(s)).  Assessment/Plan: Carmen Shannon is a 51 y.o. female present for OV for  Cough/Acute viral syndrome Rest, hydrate. Hydrate.  Bland soft diet next few days Zofran prescribed for nausea if occurs Tessalon perles for cough.  Viral illness- no abx neccessary, OTC tx. Pt reports understanding.   F/U 2 weeks if not improved.    Reviewed expectations re: course of current medical issues. Discussed self-management  of symptoms. Outlined signs and symptoms indicating need for more acute intervention. Patient verbalized understanding and all questions were answered. Patient received an After-Visit Summary.    No orders of the defined types were placed in this encounter.  No orders of the defined types were placed in this encounter.  Referral Orders  No referral(s) requested today     Note is dictated utilizing voice recognition software. Although note has been proof read prior to signing, occasional typographical errors still can be missed. If any questions arise, please do not hesitate to call for verification.   electronically signed by:  Howard Pouch, DO  [place]

## 2021-12-14 NOTE — Patient Instructions (Signed)

## 2022-02-05 ENCOUNTER — Other Ambulatory Visit: Payer: Self-pay | Admitting: Family Medicine

## 2022-04-17 ENCOUNTER — Telehealth: Payer: Self-pay

## 2022-04-17 MED ORDER — BUPROPION HCL ER (XL) 300 MG PO TB24: 300.0000 mg | ORAL_TABLET | Freq: Every day | ORAL | 0 refills | Status: DC

## 2022-04-17 NOTE — Telephone Encounter (Signed)
Rx sent to last to appt

## 2022-04-17 NOTE — Telephone Encounter (Signed)
Patient refill request.  [facility]  buPROPion (WELLBUTRIN XL) 300 MG 24 hr tablet [[id number]]   Patient scheduled appt with Dr. Raoul Pitch for 7/11

## 2022-04-25 ENCOUNTER — Other Ambulatory Visit: Payer: Self-pay

## 2022-04-25 MED ORDER — AMITRIPTYLINE HCL 10 MG PO TABS
10.0000 mg | ORAL_TABLET | Freq: Every day | ORAL | 0 refills | Status: DC
Start: 2022-04-25 — End: 2022-04-29

## 2022-04-26 ENCOUNTER — Other Ambulatory Visit: Payer: Self-pay | Admitting: Family Medicine

## 2022-04-30 ENCOUNTER — Ambulatory Visit (INDEPENDENT_AMBULATORY_CARE_PROVIDER_SITE_OTHER): Payer: 59 | Admitting: Family Medicine

## 2022-04-30 ENCOUNTER — Encounter: Payer: Self-pay | Admitting: Family Medicine

## 2022-04-30 ENCOUNTER — Other Ambulatory Visit: Payer: Self-pay

## 2022-04-30 VITALS — BP 95/63 | HR 85 | Temp 98.1°F | Ht 61.0 in | Wt 135.0 lb

## 2022-04-30 DIAGNOSIS — F419 Anxiety disorder, unspecified: Secondary | ICD-10-CM

## 2022-04-30 DIAGNOSIS — T451X5A Adverse effect of antineoplastic and immunosuppressive drugs, initial encounter: Secondary | ICD-10-CM

## 2022-04-30 DIAGNOSIS — F330 Major depressive disorder, recurrent, mild: Secondary | ICD-10-CM

## 2022-04-30 DIAGNOSIS — C50411 Malignant neoplasm of upper-outer quadrant of right female breast: Secondary | ICD-10-CM | POA: Diagnosis not present

## 2022-04-30 DIAGNOSIS — D701 Agranulocytosis secondary to cancer chemotherapy: Secondary | ICD-10-CM | POA: Diagnosis not present

## 2022-04-30 DIAGNOSIS — I5022 Chronic systolic (congestive) heart failure: Secondary | ICD-10-CM | POA: Diagnosis not present

## 2022-04-30 DIAGNOSIS — I10 Essential (primary) hypertension: Secondary | ICD-10-CM

## 2022-04-30 DIAGNOSIS — E785 Hyperlipidemia, unspecified: Secondary | ICD-10-CM

## 2022-04-30 DIAGNOSIS — Z170 Estrogen receptor positive status [ER+]: Secondary | ICD-10-CM

## 2022-04-30 MED ORDER — BUPROPION HCL ER (XL) 300 MG PO TB24: 300.0000 mg | ORAL_TABLET | Freq: Every day | ORAL | 1 refills | Status: DC

## 2022-04-30 MED ORDER — SUMATRIPTAN SUCCINATE 100 MG PO TABS
100.0000 mg | ORAL_TABLET | Freq: Every day | ORAL | 5 refills | Status: DC | PRN
Start: 2022-04-30 — End: 2023-01-13

## 2022-04-30 MED ORDER — AMITRIPTYLINE HCL 10 MG PO TABS: 20.0000 mg | ORAL_TABLET | Freq: Every day | ORAL | 1 refills | Status: DC

## 2022-04-30 MED ORDER — ATORVASTATIN CALCIUM 20 MG PO TABS: 20.0000 mg | ORAL_TABLET | Freq: Every day | ORAL | 3 refills | Status: DC

## 2022-04-30 NOTE — Progress Notes (Signed)
Patient ID: Carmen Shannon, female  DOB: [birth year], 51 y.o.   MRN: [id number] Patient Care Team    Relationship Specialty Notifications Start End  Ma Hillock, DO PCP - General Family Medicine  05/25/19   Mollie Germany, MD Referring Physician Hematology and Oncology  05/25/19   Johnston Ebbs, [place]  Physician Assistant  05/25/19   Draeger, Stevphen Rochester, MD Referring Physician Orthopedic Surgery  05/25/19     Chief Complaint  Patient presents with   Hypertension    Cmc; pt is fasting    Subjective: Carmen Shannon is a 51 y.o.  female present for f/u [facility] Migraine: Pt reports headaches are better than prior. Still present but the start of amitrip has helped. Still using imitrex prn Prior note: Pt reports she is having 4-6 migraines a month, but she has noticed more "regular" headaches.  She does feel the headaches  are partially stress related.  Her associated symptoms with migraines or dizziness, nausea and sometimes vomiting.She endorses a long standing h/o migraines.  She use to take imitrex for her headaches and it is helpful.  She has a h/o breast cancer and is on chemo. She saw her onc which did perform an MRI of the brain which was normal and reassuring on 03/24/2020  Depression with anxiety:  Patient presents today to discuss her depression and anxiety.  She reports overall her depression is ok on Wellbutrin 300 mg daily.  She has days she misses her son a great deal and the 3 yr anniversary of is passing is coming up. She will be alone this year for this day, which has her sad.  She is still struggling with guilt. Last visit amitrip was added. Today she states ithas helped some and definitely helped with sleep. It does not make her groggy next day. She never tapered to 20 mg.  Prior note:  Patient reports she is doing rather well with the Wellbutrin 300 mg daily and the add on fluoxetine 20 mg daily.  She still feels she may need some additional coverage, but has noticed a great deal  of improvement.  She tried the trazodone to help her with sleep, but she felt it made her feel off and jittery.  She reports now that she has been on the fluoxetine she has found she is sleeping much better and does not need the trazodone anyway.  She reports she still has times where she feels overwhelmed with sadness over losing her son, but the majority of the days she is coping. Prior note: Patient has a history of depression, that started in 2017.  She reports when she was diagnosed with breast cancer she was started on Wellbutrin to help with depression.  She reports her dose of 300 mg daily has been stable overall until recently.  2 weeks ago, 1 of her twin sons had passed away.  His twin currently still lives at home with her, but is ready to start college.  she also has a daughter that lives in [place].  She reports her family has started counseling together and are getting great benefit from the sessions.  She does endorse increased depression and anxiety today.  She states she does not sleep well at all.  Essential hypertension/heart failure Current regimen losartan 70m daily, Lasix 20 mg daily, Coreg 25 mg twice daily.    [facility].     04/30/2022    8:27 AM 07/11/2021  2:10 PM 05/22/2021   11:42 AM 02/11/2020   12:57 PM 08/17/2019    1:58 PM  Depression screen PHQ 2/9  Decreased Interest 1 1 0 1 1  Down, Depressed, Hopeless 1 2 0 3 3  PHQ - 2 Score 2 3 0 4 4  Altered sleeping 1 2 0 2 3  Tired, decreased energy 1 1 0 3 3  Change in appetite 2 1 0 0 0  Feeling bad or failure about yourself  1 1 0 3 0  Trouble concentrating 1 0 0 1 1  Moving slowly or fidgety/restless 1 0 0 0 0  Suicidal thoughts 0 0 0 0 0  PHQ-9 Score 9 8 0 13 11  Difficult doing work/chores    Very difficult Somewhat difficult      04/30/2022    8:28 AM 07/11/2021    2:10 PM 05/25/2019   11:19 AM  GAD 7 : Generalized Anxiety Score  Nervous, Anxious, on  Edge '2 2 3  ' Control/stop worrying '1 2 3  ' Worry too much - different things '1 2 3  ' Trouble relaxing '1 2 3  ' Restless '1 2 3  ' Easily annoyed or irritable '1 2 3  ' Afraid - awful might happen '1 2 3  ' Total GAD 7 Score '8 14 21  ' Anxiety Difficulty   Very difficult           No data to display          Immunization History  Administered Date(s) Administered   Hepatitis A 04/28/2017, 01/26/2018   Influenza Inj Mdck Quad Pf 08/05/2019   Influenza,inj,Quad PF,6+ Mos 08/05/2017, 08/22/2021   Influenza-Unspecified 07/21/2020   Moderna Sars-Covid-2 Vaccination 12/27/2019, 01/20/2020   PFIZER(Purple Top)SARS-COV-2 Vaccination 10/15/2020   Pfizer Covid-19 Vaccine Bivalent Booster 45yr & up 08/22/2021   Tdap 04/28/2017    No results found.  Past Medical History:  Diagnosis Date   [place]    [place] (congestive heart [place]) [place]   From Chemo   Chicken pox    COVID-19 05/22/2021   Depression    Migraines    Pleural effusion 02/05/2017   Right thyroid nodule 05/29/2016   Allergies  Allergen Reactions   Diphenhydramine Hcl Other (See Comments)    Paradoxical reaction.  "Hyper"   Past Surgical History:  Procedure Laterality Date   BREAST BIOPSY  2017   BUNIONECTOMY     CESAREAN SECTION     1997, 2001   COLONOSCOPY  2019   MASTECTOMY Bilateral 04/2019   TONSILLECTOMY AND ADENOIDECTOMY     WISDOM TOOTH EXTRACTION     Family History  Problem Relation Age of Onset   Lung cancer Mother    Alzheimer's disease Maternal Grandmother    Heart attack Maternal Grandfather    Social History   Social History Narrative   Marital status/children/pets: married. 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  "Hyper"        Medication List        Accurate as of April 30, 2022  8:53 AM. If you have any  questions, ask your nurse or doctor.          STOP taking these medications    benzonatate 200 MG capsule Commonly known as: TESSALON Stopped by: RHoward Pouch DO  leuprolide 11.25 MG Kit injection Commonly known as: LUPRON Stopped by: Howard Pouch, DO   ondansetron 4 MG tablet Commonly known as: ZOFRAN Stopped by: Howard Pouch, DO       TAKE these medications    amitriptyline 10 MG tablet Commonly known as: ELAVIL Take 2 tablets (20 mg total) by mouth at bedtime. What changed: how much to take Changed by: Howard Pouch, DO   atorvastatin 20 MG tablet Commonly known as: LIPITOR Take 1 tablet (20 mg total) by mouth at bedtime.   buPROPion 300 MG 24 hr tablet Commonly known as: WELLBUTRIN XL Take 1 tablet (300 mg total) by mouth daily.   carvedilol 25 MG tablet Commonly known as: COREG Take by mouth 2 (two) times daily.   furosemide 20 MG tablet Commonly known as: LASIX Take by mouth as needed.   letrozole 2.5 MG tablet Commonly known as: FEMARA Take by mouth.   losartan 25 MG tablet Commonly known as: COZAAR Take 25 mg by mouth at bedtime.   oxybutynin 5 MG tablet Commonly known as: DITROPAN Take 5 mg by mouth 2 (two) times daily.   SUMAtriptan 100 MG tablet Commonly known as: IMITREX Take 1 tablet (100 mg total) by mouth daily as needed for migraine. May rpt dose one in 2 hrs if needed What changed:  when to take this reasons to take this additional instructions Changed by: Howard Pouch, DO        All past medical history, surgical history, allergies, family history, immunizations andmedications were updated in the EMR today and reviewed under the history and medication portions of their EMR.    No results found for this or any previous visit (from the past 2160 hour(s)).  Patient was never admitted.   ROS: 14 pt review of systems performed and negative (unless mentioned in an HPI)  Objective: BP 95/63   Pulse 85   Temp 98.1 F (36.7  C) (Oral)   Ht '5\' 1"'  (1.549 m)   Wt 135 lb (61.2 kg)   SpO2 98%   BMI 25.51 kg/m  Physical Exam Vitals and nursing note reviewed.  Constitutional:      General: She is not in acute distress.    Appearance: Normal appearance. She is not ill-appearing, toxic-appearing or diaphoretic.  HENT:     Head: Normocephalic and atraumatic.  Eyes:     General: No scleral icterus.       Right eye: No discharge.        Left eye: No discharge.     Extraocular Movements: Extraocular movements intact.     Conjunctiva/sclera: Conjunctivae normal.     Pupils: Pupils are equal, round, and reactive to light.  Cardiovascular:     Rate and Rhythm: Normal rate and regular rhythm.  Pulmonary:     Effort: Pulmonary effort is normal. No respiratory distress.     Breath sounds: Normal breath sounds. No wheezing, rhonchi or rales.  Musculoskeletal:     Cervical back: Neck supple. No tenderness.     Right lower leg: No edema.     Left lower leg: No edema.  Lymphadenopathy:     Cervical: No cervical adenopathy.  Skin:    General: Skin is warm and dry.     Coloration: Skin is not jaundiced or pale.     Findings: No erythema or rash.  Neurological:     Mental Status: She is alert and oriented to person, place, and time. Mental status is at baseline.  Motor: No weakness.     Gait: Gait normal.  Psychiatric:        Mood and Affect: Mood normal.        Behavior: Behavior normal.        Thought Content: Thought content normal.        Judgment: Judgment normal.     Assessment/plan: Carmen Shannon is a 51 y.o. female present for\ f/u Essential hypertension/heart failure stable Current regimen losartan 25 mg daily, Lasix 20 mg daily PRN, Coreg 25 mg twice daily.    Follows with [facility]ology at [facility] prescribes her medications.  Malignant neoplasm of upper-outer quadrant of right breast in female, estrogen receptor positive ([place]) She has routine follow-ups with her specialist- which prescribe  ditropan, and femara Follows routinely w/ onc.  Depression, recurrent (HCC)/Grief reaction/anxiety/sleep disturbance - stable. Appropriate grief for this time of yr when her son passed (3 yrs ago) -She and her family are no longer attending therapy. Encouraged her to call her psychologist and ask if they could recommend a peer support group for her.  - increase elavil 10-20 mg qhs (sleep, hdx, d/a). could consider remeron if needing alt med for tension hdx/sleep d/o -continue Wellbutrin 300 mg daily.  -tried/failed: prozac. seroquel (sedation) and trazodone(jittery).  - could consider Effexor in place of Wellbutrin.  She may even get benefit from hot flashes from her medication with that medication. - f/u 5.5 mos.   Other migraine without status migrainosus, not intractable - Migraine stable. Tension headaches increased.  - continue Imitrex - increase elavil 20- hopefully extra benefit of headache coverage. If tolerating 20 mg, will prescribe 25 mg tab next appt. - could consider remeron if needing alt med for tension hdx/sleep d/o -Encouraged her to start a B complex vitamin and low-dose magnesium daily - discussed headache log and if having more frequent headaches a month we could consider preventive medication.  - MRI by Onc reviewed and reassuring.    Return in about 24 weeks (around 10/15/2022) for cpe (40 min), Routine chronic condition follow-up. No orders of the defined types were placed in this encounter.   Meds ordered this encounter  Medications   amitriptyline (ELAVIL) 10 MG tablet    Sig: Take 2 tablets (20 mg total) by mouth at bedtime.    Dispense:  180 tablet    Refill:  1   atorvastatin (LIPITOR) 20 MG tablet    Sig: Take 1 tablet (20 mg total) by mouth at bedtime.    Dispense:  90 tablet    Refill:  3   buPROPion (WELLBUTRIN XL) 300 MG 24 hr tablet    Sig: Take 1 tablet (300 mg total) by mouth daily.    Dispense:  90 tablet    Refill:  1   SUMAtriptan  (IMITREX) 100 MG tablet    Sig: Take 1 tablet (100 mg total) by mouth daily as needed for migraine. May rpt dose one in 2 hrs if needed    Dispense:  10 tablet    Refill:  5    Referral Orders  No referral(s) requested today     Note is dictated utilizing voice recognition software. Although note has been proof read prior to signing, occasional typographical errors still can be missed. If any questions arise, please do not hesitate to call for verification.  Electronically signed by: Howard Pouch, DO [place]

## 2022-04-30 NOTE — Patient Instructions (Addendum)
Return in about 24 weeks (around 10/15/2022) for cpe (40 min), Routine chronic condition follow-up.        Great to see you today.  I have refilled the medication(s) we provide.   If labs were collected, we will inform you of lab results once received either by echart message or telephone call.   - echart message- for normal results that have been seen by the patient already.   - telephone call: abnormal results or if patient has not viewed results in their echart.

## 2022-05-09 ENCOUNTER — Ambulatory Visit (INDEPENDENT_AMBULATORY_CARE_PROVIDER_SITE_OTHER): Payer: 59

## 2022-05-09 ENCOUNTER — Ambulatory Visit (INDEPENDENT_AMBULATORY_CARE_PROVIDER_SITE_OTHER): Payer: 59 | Admitting: Podiatry

## 2022-05-09 ENCOUNTER — Encounter: Payer: Self-pay | Admitting: Podiatry

## 2022-05-09 DIAGNOSIS — M2040 Other hammer toe(s) (acquired), unspecified foot: Secondary | ICD-10-CM

## 2022-05-09 DIAGNOSIS — M2042 Other hammer toe(s) (acquired), left foot: Secondary | ICD-10-CM | POA: Diagnosis not present

## 2022-05-09 DIAGNOSIS — M2041 Other hammer toe(s) (acquired), right foot: Secondary | ICD-10-CM

## 2022-05-09 NOTE — Progress Notes (Signed)
Subjective:  Patient ID: Carmen Shannon, female    DOB: September 21, 1971,  MRN: [id number] HPI Chief Complaint  Patient presents with   Toe Pain    2nd and 3rd toes bilateral (L>R) - hammertoe deformity x years, previous bunion surgery, shoes are very uncomfortable at times   New Patient (Initial Visit)    Est pt 08/2019    51 y.o. female presents with the above complaint.   ROS: Denies fever chills nausea vomiting muscle aches pains calf pain back pain chest pain shortness of breath.  History of breast cancer with chemo resulting in congestive heart failure which has somewhat resolved most recent ejection fraction is 50.    Past Medical History:  Diagnosis Date   Breast cancer ([place])    CHF (congestive heart failure) ([place]) 2017   From Chemo   Chicken pox    COVID-19 05/22/2021   Depression    Migraines    Pleural effusion 02/05/2017   Right thyroid nodule 05/29/2016   Past Surgical History:  Procedure Laterality Date   BREAST BIOPSY  2017   [place], 2001   COLONOSCOPY  2019   MASTECTOMY Bilateral 04/2019   TONSILLECTOMY AND ADENOIDECTOMY     WISDOM TOOTH EXTRACTION      Current Outpatient Medications:    amitriptyline (ELAVIL) 10 MG tablet, Take 2 tablets (20 mg total) by mouth at bedtime., Disp: 180 tablet, Rfl: 1   atorvastatin (LIPITOR) 20 MG tablet, Take 1 tablet (20 mg total) by mouth at bedtime., Disp: 90 tablet, Rfl: 3   buPROPion (WELLBUTRIN XL) 300 MG 24 hr tablet, Take 1 tablet (300 mg total) by mouth daily., Disp: 90 tablet, Rfl: 1   carvedilol (COREG) 25 MG tablet, Take by mouth 2 (two) times daily., Disp: , Rfl:    furosemide (LASIX) 20 MG tablet, Take by mouth as needed., Disp: , Rfl:    letrozole (FEMARA) 2.5 MG tablet, Take by mouth., Disp: , Rfl:    losartan (COZAAR) 25 MG tablet, Take 25 mg by mouth at bedtime., Disp: , Rfl:    oxybutynin (DITROPAN) 5 MG tablet, Take 5 mg by mouth 2 (two) times daily., Disp: , Rfl:     SUMAtriptan (IMITREX) 100 MG tablet, Take 1 tablet (100 mg total) by mouth daily as needed for migraine. May rpt dose one in 2 hrs if needed, Disp: 10 tablet, Rfl: 5  Allergies  Allergen Reactions   Diphenhydramine Hcl Other (See Comments)    Paradoxical reaction.  "Hyper"   Review of Systems Objective:  There were no vitals filed for this visit.  General: Well developed, nourished, in no acute distress, alert and oriented x3   Dermatological: Skin is warm, dry and supple bilateral. Nails x 10 are well maintained; remaining integument appears unremarkable at this time. There are no open sores, no preulcerative lesions, no rash or signs of infection present.  Vascular: Dorsalis Pedis artery and Posterior Tibial artery pedal pulses are 2/4 bilateral with immedate capillary fill time. Pedal hair growth present. No varicosities and no lower extremity edema present bilateral.   Neruologic: Grossly intact via light touch bilateral. Vibratory intact via tuning fork bilateral. Protective threshold with Semmes Wienstein monofilament intact to all pedal sites bilateral. Patellar and Achilles deep tendon reflexes 2+ bilateral. No Babinski or clonus noted bilateral.   Musculoskeletal: No gross boney pedal deformities bilateral. No pain, crepitus, or limitation noted with foot and ankle range of motion bilateral.  Muscular strength 5/5 in all groups tested bilateral.  She has good full range of motion of the first metatarsophalangeal joint though does demonstrate hallux abductovalgus deformities bilaterally.  Cocked up hammertoe deformities #2 #3 #4 #5 bilaterally with the second to the left being most symptomatic with fluctuance and warmth on palpation of the second metatarsophalangeal joint.  She has pain on end range of motion of this joint.  Though they all seem to be reducible at the metatarsal phalangeal joint.  Gait: Unassisted, Nonantalgic.    Radiographs:  Radiographs taken today bilateral foot  demonstrate osseously mature individual right foot does demonstrate a bunion repair to the first metatarsal capital osteotomy with no osteoarthritic results.  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  Cystic degeneration is noted medial deviation of the second toe is noted from the joint.  Most likely a plantar plate rupture.  Left foot demonstrates an osseously mature individual very large 4 oh screw to the head of the right first metatarsal for bunion repair.  This the only part that appears to have had any warmth on that left foot she does have hammertoe deformities #2 #3 #4 #5 with some deviation at the metatarsal phalangeal joints #2 only.  This does not appear to be dislocated does not appear to be a capsular abnormality at this point.  Assessment & Plan:   Assessment: Capsulitis hammertoe deformity second metatarsophalangeal joint bilateral with hammertoe deformities to toes #2 #3 #4 #5 bilaterally.    Plan: Discussed etiology pathology conservative surgical therapies at this point due to the chronicity of the left foot she would like to have this surgically corrected.  All conservative therapy such as shoe gear changes anti-inflammatories have failed to alleviate her symptoms.  So at this point we consented her for a second metatarsal osteotomy hammertoe repair #2 and #3 with K wires flexor tenotomy's to toes #3 and #4 on the left foot.  We did discuss the possible postop complications which may include but not limited to postop pain bleeding swell infection recurrence need for further surgery overcorrection under correction also digit loss limb loss of life.     Breana Litts T. [place], [place]

## 2022-05-24 ENCOUNTER — Encounter: Payer: Self-pay | Admitting: Podiatry

## 2022-07-11 ENCOUNTER — Telehealth: Payer: Self-pay | Admitting: Urology

## 2022-07-11 NOTE — Telephone Encounter (Signed)
DOS - 08/02/22  METATARSAL OSTEOTOMY 2ND LEFT --- 92493 TENOTOMY 4,5 LEFT --- 28010 HAMMERTOE REPAIR 2, 3 LEFT --- 28285  [facility] EFFECTIVE DATE - 03/21/22  PLAN DEDUCTIBLE - $500.00 W/ $0.00 REMAINING OUT OF POCKET - $3,000.00 W/ $0.00 REMAINING COINSURANCE - 15% COPAY - $0.00   PER [employer] WEBSITE FOR CPT CODES 24199, 28010 AND 14445 HAVE BEEN APPROVED, AUTH # [id number], GOOD FROM 08/02/22 - 10/31/22.

## 2022-07-29 ENCOUNTER — Telehealth: Payer: Self-pay | Admitting: *Deleted

## 2022-07-29 NOTE — Telephone Encounter (Signed)
Patient is calling to ask if it is okay to have a temporary crown today, having surgery on Friday. Scheduled this morning @ 9:30.  Please advise.

## 2022-07-29 NOTE — Telephone Encounter (Signed)
Returned the call to patient to let her know that it will be fine, verbalized understanding.

## 2022-07-31 ENCOUNTER — Other Ambulatory Visit: Payer: Self-pay | Admitting: Podiatry

## 2022-07-31 MED ORDER — ONDANSETRON HCL 4 MG PO TABS: 4.0000 mg | ORAL_TABLET | Freq: Three times a day (TID) | ORAL | 0 refills | Status: DC | PRN

## 2022-07-31 MED ORDER — OXYCODONE-ACETAMINOPHEN 10-325 MG PO TABS: 1.0000 | ORAL_TABLET | Freq: Three times a day (TID) | ORAL | 0 refills | Status: AC | PRN

## 2022-07-31 MED ORDER — CEPHALEXIN 500 MG PO CAPS: 500.0000 mg | ORAL_CAPSULE | Freq: Three times a day (TID) | ORAL | 0 refills | Status: DC

## 2022-08-02 DIAGNOSIS — M21542 Acquired clubfoot, left foot: Secondary | ICD-10-CM | POA: Diagnosis not present

## 2022-08-02 DIAGNOSIS — M2042 Other hammer toe(s) (acquired), left foot: Secondary | ICD-10-CM | POA: Diagnosis not present

## 2022-08-08 ENCOUNTER — Ambulatory Visit (INDEPENDENT_AMBULATORY_CARE_PROVIDER_SITE_OTHER): Payer: 59

## 2022-08-08 ENCOUNTER — Ambulatory Visit (INDEPENDENT_AMBULATORY_CARE_PROVIDER_SITE_OTHER): Payer: 59 | Admitting: Podiatry

## 2022-08-08 DIAGNOSIS — M2040 Other hammer toe(s) (acquired), unspecified foot: Secondary | ICD-10-CM

## 2022-08-08 DIAGNOSIS — Z09 Encounter for follow-up examination after completed treatment for conditions other than malignant neoplasm: Secondary | ICD-10-CM

## 2022-08-08 NOTE — Progress Notes (Signed)
  Subjective:  Patient ID: Carmen Shannon, female    DOB: 1971-06-03,  MRN: [id number]  Chief Complaint  Patient presents with   Routine Post Op    POV #1 DOS 08/02/2022 LT 2ND METATARSAL OSTEOTOMY, HAMMERTOE REPAIR 2,3 LT, FLEXOR TENOTOMIES 4,5 LT/DR HYAT PT     51 y.o. female returns for post-op check.  She is doing well not having much pain  Review of Systems: Negative except as noted in the HPI. Denies N/V/F/Ch.   Objective:  There were no vitals filed for this visit. There is no height or weight on file to calculate BMI. Constitutional Well developed. Well nourished.  Vascular Foot warm and well perfused. Capillary refill normal to all digits.  Calf is soft and supple, no posterior calf or knee pain, negative Homans' sign  Neurologic Normal speech. Oriented to person, place, and time. Epicritic sensation to light touch grossly present bilaterally.  Dermatologic Skin healing well without signs of infection. Skin edges well coapted without signs of infection.  Orthopedic: Tenderness to palpation noted about the surgical site.   Multiple view plain film radiographs: Hardware intact and in good position with correction noted through second third toes and second metatarsal Assessment:   1. Postoperative examination   2. Hammer toe, unspecified laterality    Plan:  Patient was evaluated and treated and all questions answered.  S/p foot surgery left -Progressing as expected post-operatively. -XR: Noted above -WB Status: WBAT in cam walker boot -Sutures: She will return next week for suture removal. -Medications: No refills required -Foot redressed.  Return in about 1 week (around 08/15/2022) for suture removal, post op (no x-rays).

## 2022-08-15 ENCOUNTER — Ambulatory Visit (INDEPENDENT_AMBULATORY_CARE_PROVIDER_SITE_OTHER): Payer: 59 | Admitting: Podiatry

## 2022-08-15 DIAGNOSIS — Z09 Encounter for follow-up examination after completed treatment for conditions other than malignant neoplasm: Secondary | ICD-10-CM

## 2022-08-15 DIAGNOSIS — M2040 Other hammer toe(s) (acquired), unspecified foot: Secondary | ICD-10-CM

## 2022-08-15 NOTE — Progress Notes (Signed)
  Subjective:  Patient ID: Carmen Shannon, female    DOB: 1971-02-05,  MRN: [id number]  Chief Complaint  Patient presents with   Routine Post Op    POV #2 DOS 08/02/2022 LT 2ND METATARSAL OSTEOTOMY, HAMMERTOE REPAIR 2,3 LT, FLEXOR TENOTOMIES 4,5 LT/DR HYAT PT     51 y.o. female returns for post-op check.  Still having much pain Review of Systems: Negative except as noted in the HPI. Denies N/V/F/Ch.   Objective:  There were no vitals filed for this visit. There is no height or weight on file to calculate BMI. Constitutional Well developed. Well nourished.  Vascular Foot warm and well perfused. Capillary refill normal to all digits.  Calf is soft and supple, no posterior calf or knee pain, negative Homans' sign  Neurologic Normal speech. Oriented to person, place, and time. Epicritic sensation to light touch grossly present bilaterally.  Dermatologic Skin healing well without signs of infection. Skin edges well coapted without signs of infection.  Orthopedic: She is not having any tenderness to palpation noted about the surgical site.  There are some distal translation of the second toe pin   Multiple view plain film radiographs: Hardware intact and in good position with correction noted through second third toes and second metatarsal Assessment:   1. Postoperative examination   2. Hammer toe, unspecified laterality    Plan:  Patient was evaluated and treated and all questions answered.  S/p foot surgery left -Progressing as expected post-operatively.  Discussed activity level with her.  I do think she is probably doing too much there is some distal translation of the Kirschner wire but this did not compromise the surgical site.  Advised to rest is much as possible. -NO:TRRN visit -WB Status: WBAT in cam walker boot -Sutures: Removed today.  May begin bathing  No follow-ups on file.

## 2022-08-20 ENCOUNTER — Telehealth: Payer: Self-pay | Admitting: *Deleted

## 2022-08-20 ENCOUNTER — Ambulatory Visit: Payer: 59 | Admitting: Podiatry

## 2022-08-20 ENCOUNTER — Ambulatory Visit (INDEPENDENT_AMBULATORY_CARE_PROVIDER_SITE_OTHER): Payer: 59

## 2022-08-20 DIAGNOSIS — M2040 Other hammer toe(s) (acquired), unspecified foot: Secondary | ICD-10-CM

## 2022-08-20 DIAGNOSIS — M2042 Other hammer toe(s) (acquired), left foot: Secondary | ICD-10-CM

## 2022-08-20 NOTE — Telephone Encounter (Signed)
Spoke with patient and she will be in at 3:00 today.

## 2022-08-20 NOTE — Telephone Encounter (Signed)
Patient is calling because one of her pins is pushing it's way out of the toe.please advise.

## 2022-08-20 NOTE — Progress Notes (Signed)
Patient presents today for urgent visit due to Kirschner wire translating distally on the second toe, radiographs taken today and multiple views show that there was approximately 5 to 6 mm shift distally but did not cross the PIPJ and correction is maintained.  I recommended cutting the wire short which was done using a pin cutter.  She tolerated this well.  She will return for her scheduled follow-up visit.

## 2022-08-29 ENCOUNTER — Encounter: Payer: 59 | Admitting: Podiatry

## 2022-09-03 ENCOUNTER — Encounter: Payer: 59 | Admitting: Podiatry

## 2022-09-10 ENCOUNTER — Encounter: Payer: Self-pay | Admitting: Podiatry

## 2022-09-10 ENCOUNTER — Ambulatory Visit (INDEPENDENT_AMBULATORY_CARE_PROVIDER_SITE_OTHER): Payer: 59 | Admitting: Podiatry

## 2022-09-10 ENCOUNTER — Ambulatory Visit (INDEPENDENT_AMBULATORY_CARE_PROVIDER_SITE_OTHER): Payer: 59

## 2022-09-10 DIAGNOSIS — Z9889 Other specified postprocedural states: Secondary | ICD-10-CM

## 2022-09-10 DIAGNOSIS — M2040 Other hammer toe(s) (acquired), unspecified foot: Secondary | ICD-10-CM

## 2022-09-10 DIAGNOSIS — M2042 Other hammer toe(s) (acquired), left foot: Secondary | ICD-10-CM | POA: Diagnosis not present

## 2022-09-10 NOTE — Progress Notes (Signed)
She presents today date of surgery 1013 2023-second and third hammertoe repair with a second metatarsal osteotomy and then flexor tenotomy's to toes 4 and 5.  She states is feeling pretty good no problems at this point.  Denies fever chills nausea vomit muscle aches pains.  Objective: Vital signs are stable she is alert and oriented x3.  There is appear to be healing very well though they are edematous she does have hallux valgus which is pushing the second and third toes over toes 4 and 5 because of the flexor tenotomy's are down so we need to really stretch these toes at this point.  Radiographs taken today demonstrate time K wires intact to toes 2 and 3 of the left foot with arthrodesis intact.  Assessment: Well-healing surgical toes.  Pulled the pins today.  Pins were removed in total.  There was no bleeding.  We dressed the toes today and I will follow-up with her in a couple weeks.  Plan follow-up with her in a couple weeks redressed the toes today with compression direction demonstrated this to her and provided her with [place].

## 2022-10-10 ENCOUNTER — Encounter: Payer: Self-pay | Admitting: Podiatry

## 2022-10-10 ENCOUNTER — Ambulatory Visit (INDEPENDENT_AMBULATORY_CARE_PROVIDER_SITE_OTHER): Payer: 59

## 2022-10-10 ENCOUNTER — Ambulatory Visit (INDEPENDENT_AMBULATORY_CARE_PROVIDER_SITE_OTHER): Payer: 59 | Admitting: Podiatry

## 2022-10-10 DIAGNOSIS — M2042 Other hammer toe(s) (acquired), left foot: Secondary | ICD-10-CM

## 2022-10-10 DIAGNOSIS — Z9889 Other specified postprocedural states: Secondary | ICD-10-CM

## 2022-10-10 NOTE — Progress Notes (Signed)
She presents today date of surgery is 08/02/2022 supplementals osteotomy and hammertoe repair #2 and #3 of the left foot flexor tenotomy's #4 #5 on the left foot.  She states is really feeling pretty good after being keeping it wrapped and wearing the Darco shoe.  She is wondering why the third toe is still little bit curved.  Objective: Vital signs stable she is alert and oriented x 3 pulses are palpable.  Presents with her lumbar pain she does have some curvature to the third toe but this is most likely secondary to her met adductus and the lateral deviation of the lesser toes hallux valgus deformity.  Second digit is fused nicely.  She has great range of motion on physical exam.  Minimal edema no erythema cellulitis or odor.  Assessment: Well-healing surgical foot.  Plan: She will follow-up with [place] on as-needed basis get back to her regular shoe gear.

## 2022-10-17 ENCOUNTER — Ambulatory Visit (INDEPENDENT_AMBULATORY_CARE_PROVIDER_SITE_OTHER): Payer: 59 | Admitting: Family Medicine

## 2022-10-17 DIAGNOSIS — Z91199 Patient's noncompliance with other medical treatment and regimen due to unspecified reason: Secondary | ICD-10-CM | POA: Diagnosis not present

## 2022-10-17 DIAGNOSIS — Z170 Estrogen receptor positive status [ER+]: Secondary | ICD-10-CM

## 2022-10-17 DIAGNOSIS — T451X5A Adverse effect of antineoplastic and immunosuppressive drugs, initial encounter: Secondary | ICD-10-CM

## 2022-10-17 DIAGNOSIS — D701 Agranulocytosis secondary to cancer chemotherapy: Secondary | ICD-10-CM | POA: Diagnosis not present

## 2022-10-17 DIAGNOSIS — C50411 Malignant neoplasm of upper-outer quadrant of right female breast: Secondary | ICD-10-CM

## 2022-10-17 DIAGNOSIS — I10 Essential (primary) hypertension: Secondary | ICD-10-CM | POA: Diagnosis not present

## 2022-10-17 DIAGNOSIS — Z0000 Encounter for general adult medical examination without abnormal findings: Secondary | ICD-10-CM | POA: Diagnosis not present

## 2022-10-17 DIAGNOSIS — E785 Hyperlipidemia, unspecified: Secondary | ICD-10-CM

## 2022-10-17 DIAGNOSIS — I5022 Chronic systolic (congestive) heart failure: Secondary | ICD-10-CM | POA: Diagnosis not present

## 2022-10-17 DIAGNOSIS — F330 Major depressive disorder, recurrent, mild: Secondary | ICD-10-CM | POA: Diagnosis not present

## 2022-10-17 DIAGNOSIS — F419 Anxiety disorder, unspecified: Secondary | ICD-10-CM

## 2022-10-17 NOTE — Progress Notes (Signed)
  No show for cpe - 2nd occurrence this month.

## 2022-11-12 ENCOUNTER — Encounter: Payer: Self-pay | Admitting: Family Medicine

## 2022-11-29 ENCOUNTER — Other Ambulatory Visit: Payer: Self-pay

## 2022-12-04 ENCOUNTER — Other Ambulatory Visit: Payer: Self-pay

## 2022-12-04 MED ORDER — BUPROPION HCL ER (XL) 300 MG PO TB24: 300.0000 mg | ORAL_TABLET | Freq: Every day | ORAL | 0 refills | Status: DC

## 2022-12-20 ENCOUNTER — Other Ambulatory Visit: Payer: Self-pay

## 2023-01-01 MED ORDER — BUPROPION HCL ER (XL) 300 MG PO TB24: 300.0000 mg | ORAL_TABLET | Freq: Every day | ORAL | 0 refills | Status: DC

## 2023-01-01 NOTE — Telephone Encounter (Signed)
Patient called regarding refill request.  She states (per pharmacy) her insurance has denied approval for filling medication because they will only approve 90 d/s of this medication.  [facility]  buPROPion (WELLBUTRIN XL) 300 MG 24 hr tablet    Please advise.  Patient can be reached at [phone].

## 2023-01-01 NOTE — Addendum Note (Signed)
Addended by: Beatrix Fetters on: 01/01/2023 09:22 AM   Modules accepted: Orders

## 2023-01-14 ENCOUNTER — Encounter: Payer: Self-pay | Admitting: Family Medicine

## 2023-01-14 ENCOUNTER — Ambulatory Visit (INDEPENDENT_AMBULATORY_CARE_PROVIDER_SITE_OTHER): Payer: 59 | Admitting: Family Medicine

## 2023-01-14 VITALS — BP 121/86 | HR 78 | Temp 98.0°F | Ht 61.42 in | Wt 147.0 lb

## 2023-01-14 DIAGNOSIS — T451X5A Adverse effect of antineoplastic and immunosuppressive drugs, initial encounter: Secondary | ICD-10-CM

## 2023-01-14 DIAGNOSIS — Z0000 Encounter for general adult medical examination without abnormal findings: Secondary | ICD-10-CM

## 2023-01-14 DIAGNOSIS — E785 Hyperlipidemia, unspecified: Secondary | ICD-10-CM

## 2023-01-14 DIAGNOSIS — D701 Agranulocytosis secondary to cancer chemotherapy: Secondary | ICD-10-CM

## 2023-01-14 DIAGNOSIS — Z1159 Encounter for screening for other viral diseases: Secondary | ICD-10-CM

## 2023-01-14 DIAGNOSIS — Z1211 Encounter for screening for malignant neoplasm of colon: Secondary | ICD-10-CM

## 2023-01-14 DIAGNOSIS — F419 Anxiety disorder, unspecified: Secondary | ICD-10-CM

## 2023-01-14 DIAGNOSIS — F330 Major depressive disorder, recurrent, mild: Secondary | ICD-10-CM

## 2023-01-14 DIAGNOSIS — Z0001 Encounter for general adult medical examination with abnormal findings: Secondary | ICD-10-CM

## 2023-01-14 DIAGNOSIS — C50411 Malignant neoplasm of upper-outer quadrant of right female breast: Secondary | ICD-10-CM | POA: Diagnosis not present

## 2023-01-14 DIAGNOSIS — Z23 Encounter for immunization: Secondary | ICD-10-CM

## 2023-01-14 DIAGNOSIS — I5022 Chronic systolic (congestive) heart failure: Secondary | ICD-10-CM | POA: Diagnosis not present

## 2023-01-14 DIAGNOSIS — Z170 Estrogen receptor positive status [ER+]: Secondary | ICD-10-CM

## 2023-01-14 DIAGNOSIS — Z131 Encounter for screening for diabetes mellitus: Secondary | ICD-10-CM | POA: Diagnosis not present

## 2023-01-14 DIAGNOSIS — Z114 Encounter for screening for human immunodeficiency virus [HIV]: Secondary | ICD-10-CM

## 2023-01-14 DIAGNOSIS — I10 Essential (primary) hypertension: Secondary | ICD-10-CM | POA: Diagnosis not present

## 2023-01-14 MED ORDER — SUMATRIPTAN SUCCINATE 100 MG PO TABS: 100.0000 mg | ORAL_TABLET | Freq: Every day | ORAL | 5 refills | Status: DC | PRN

## 2023-01-14 MED ORDER — BUPROPION HCL ER (XL) 300 MG PO TB24: 300.0000 mg | ORAL_TABLET | Freq: Every day | ORAL | 1 refills | Status: DC

## 2023-01-14 MED ORDER — ATORVASTATIN CALCIUM 20 MG PO TABS: 20.0000 mg | ORAL_TABLET | Freq: Every day | ORAL | 3 refills | Status: DC

## 2023-01-14 NOTE — Patient Instructions (Addendum)
Return in about 24 weeks (around 07/01/2023) for Routine chronic condition follow-up.        Great to see you today.  I have refilled the medication(s) we provide.   If labs were collected, we will inform you of lab results once received either by echart message or telephone call.   - echart message- for normal results that have been seen by the patient already.   - telephone call: abnormal results or if patient has not viewed results in their echart.

## 2023-01-14 NOTE — Progress Notes (Signed)
Patient ID: Carmen Shannon, female  DOB: [birth year], 52 y.o.   MRN: [id number] Patient Care Team    Relationship Specialty Notifications Start End  Ma Hillock, DO PCP - General Family Medicine  05/25/19   Mollie Germany, MD Referring Physician Hematology and Oncology  05/25/19   Johnston Ebbs, [place]  Physician Assistant  05/25/19   Draeger, Stevphen Rochester, MD Referring Physician Orthopedic Surgery  05/25/19   Loletha Carrow, MD Referring Physician Internal Medicine  05/24/22     Chief Complaint  Patient presents with   Annual Exam    [facility]; pt is not fasting    Subjective: Carmen Shannon is a 52 y.o.  female present for f/u CPE and Chronic Conditions/illness Management Health maintenance:  Colonoscopy: Completed 10/21/2018. 10 yr Mammogram: History of breast cancer with mastectomy followed by oncology Cervical cancer screening:referred to gyn Immunizations: Tdap up-to-date 04/2017, influenza declined, Shingrix nurse visit ok.  Infectious disease screening: HIV and hepatitis C- declined  Migraine: She reports headaches are improved uses Imitrex as needed   Depression with anxiety:  Patient presents today to discuss her depression and anxiety.  She reports overall her depression is well-managed on Wellbutrin 300 mg daily.   Essential hypertension/heart failure Follows with cardiology at [facility] prescribes her medications.     01/14/2023    1:22 PM 04/30/2022    8:27 AM 07/11/2021    2:10 PM 05/22/2021   11:42 AM 02/11/2020   12:57 PM  Depression screen PHQ 2/9  Decreased Interest 0 1 1 0 1  Down, Depressed, Hopeless 0 1 2 0 3  PHQ - 2 Score 0 2 3 0 4  Altered sleeping 0 1 2 0 2  Tired, decreased energy 0 1 1 0 3  Change in appetite 0 2 1 0 0  Feeling bad or failure about yourself  0 1 1 0 3  Trouble concentrating 0 1 0 0 1  Moving slowly or fidgety/restless 0 1 0 0 0  Suicidal thoughts 0 0 0 0 0  PHQ-9 Score 0 9 8 0 13  Difficult doing work/chores     Very difficult       04/30/2022    8:28 AM 07/11/2021    2:10 PM 05/25/2019   11:19 AM  GAD 7 : Generalized Anxiety Score  Nervous, Anxious, on Edge 2 2 3   Control/stop worrying 1 2 3   Worry too much - different things 1 2 3   Trouble relaxing 1 2 3   Restless 1 2 3   Easily annoyed or irritable 1 2 3   Afraid - awful might happen 1 2 3   Total GAD 7 Score 8 14 21   Anxiety Difficulty   Very difficult          01/14/2023    1:22 PM  [place] in the past year? 0  Number falls in past yr: 0  Injury with Fall? 0  Follow up Falls evaluation completed    Immunization History  Administered Date(s) Administered   Hepatitis A 04/28/2017, 01/26/2018   Influenza Inj Mdck Quad Pf 08/05/2019   Influenza,inj,Quad PF,6+ Mos 08/05/2017, 08/22/2021   Influenza-Unspecified 07/21/2020   Moderna Sars-Covid-2 Vaccination 12/27/2019, 01/20/2020   PFIZER(Purple Top)SARS-COV-2 Vaccination 10/15/2020   Pfizer Covid-19 Vaccine Bivalent Booster 30yrs & up 08/22/2021   Tdap 04/28/2017    No results found.  Past Medical History:  Diagnosis Date   Breast cancer ([place])    CHF (congestive heart failure) ([place])  2017   From Chemo   Chicken pox    COVID-19 05/22/2021   Depression    Migraines    Pleural effusion 02/05/2017   Right thyroid nodule 05/29/2016   Allergies  Allergen Reactions   Oxycodone-Acetaminophen Itching   Diphenhydramine Hcl Other (See Comments)    Paradoxical reaction.  "Hyper"   Past Surgical History:  Procedure Laterality Date   BREAST BIOPSY  2017   BUNIONECTOMY     CESAREAN SECTION     1997, 2001   COLONOSCOPY  2019   MASTECTOMY Bilateral 04/2019   TONSILLECTOMY AND ADENOIDECTOMY     WISDOM TOOTH EXTRACTION     Family History  Problem Relation Age of Onset   Lung cancer Mother    Alzheimer's disease Maternal Grandmother    Heart attack Maternal Grandfather    Social History   Social History Narrative   Marital status/children/pets: married. 2 living children (1 son  (twin) deceased)   Education/employment: B.S. Engineer, site.        Allergies as of 01/14/2023       Reactions   Oxycodone-acetaminophen Itching   Diphenhydramine Hcl Other (See Comments)   Paradoxical reaction.  "Hyper"        Medication List        Accurate as of January 14, 2023  3:06 PM. If you have any questions, ask your nurse or doctor.          STOP taking these medications    ondansetron 4 MG tablet Commonly known as: Zofran Stopped by: Howard Pouch, DO   oxybutynin 5 MG tablet Commonly known as: DITROPAN Stopped by: Howard Pouch, DO       TAKE these medications    atorvastatin 20 MG tablet Commonly known as: LIPITOR Take 1 tablet (20 mg total) by mouth at bedtime.   buPROPion 300 MG 24 hr tablet Commonly known as: WELLBUTRIN XL Take 1 tablet (300 mg total) by mouth daily. What changed: additional instructions Changed by: Howard Pouch, DO   carvedilol 25 MG tablet Commonly known as: COREG Take by mouth 2 (two) times daily.   empagliflozin 10 MG Tabs tablet Commonly known as: JARDIANCE Take 1 tablet by mouth daily.   furosemide 20 MG tablet Commonly known as: LASIX Take by mouth as needed.   letrozole 2.5 MG tablet Commonly known as: FEMARA Take by mouth.   losartan 25 MG tablet Commonly known as: COZAAR Take 25 mg by mouth at bedtime.   SUMAtriptan 100 MG tablet Commonly known as: IMITREX Take 1 tablet (100 mg total) by mouth daily as needed for migraine. May rpt dose one in 2 hrs if needed        All past medical history, surgical history, allergies, family history, immunizations andmedications were updated in the EMR today and reviewed under the history and medication portions of their EMR.    No results found for this or any previous visit (from the past 2160 hour(s)).  Patient was never admitted.   ROS: 14 pt review of systems performed and negative (unless mentioned in an HPI)  Objective: BP 121/86   Pulse 78    Temp 98 F (36.7 C)   Ht 5' 1.42" (1.56 m)   Wt 147 lb (66.7 kg)   SpO2 99%   BMI 27.40 kg/m  Physical Exam Vitals and nursing note reviewed.  Constitutional:      General: She is not in acute distress.    Appearance: Normal appearance. She is not ill-appearing  or toxic-appearing.  HENT:     Head: Normocephalic and atraumatic.     Right Ear: Tympanic membrane, ear canal and external ear normal. There is no impacted cerumen.     Left Ear: Tympanic membrane, ear canal and external ear normal. There is no impacted cerumen.     Nose: No congestion or rhinorrhea.     Mouth/Throat:     Mouth: Mucous membranes are moist.     Pharynx: Oropharynx is clear. No oropharyngeal exudate or posterior oropharyngeal erythema.  Eyes:     General: No scleral icterus.       Right eye: No discharge.        Left eye: No discharge.     Extraocular Movements: Extraocular movements intact.     Conjunctiva/sclera: Conjunctivae normal.     Pupils: Pupils are equal, round, and reactive to light.  Cardiovascular:     Rate and Rhythm: Normal rate and regular rhythm.     Pulses: Normal pulses.     Heart sounds: Normal heart sounds. No murmur heard.    No friction rub. No gallop.  Pulmonary:     Effort: Pulmonary effort is normal. No respiratory distress.     Breath sounds: Normal breath sounds. No stridor. No wheezing, rhonchi or rales.  Chest:     Chest wall: No tenderness.  Abdominal:     General: Abdomen is flat. Bowel sounds are normal. There is no distension.     Palpations: Abdomen is soft. There is no mass.     Tenderness: There is no abdominal tenderness. There is no right CVA tenderness, left CVA tenderness, guarding or rebound.     Hernia: No hernia is present.  Musculoskeletal:        General: No swelling, tenderness or deformity. Normal range of motion.     Cervical back: Normal range of motion and neck supple. No rigidity or tenderness.     Right lower leg: No edema.     Left lower leg: No  edema.  Lymphadenopathy:     Cervical: No cervical adenopathy.  Skin:    General: Skin is warm and dry.     Coloration: Skin is not jaundiced or pale.     Findings: No bruising, erythema, lesion or rash.  Neurological:     General: No focal deficit present.     Mental Status: She is alert and oriented to person, place, and time. Mental status is at baseline.     Cranial Nerves: No cranial nerve deficit.     Sensory: No sensory deficit.     Motor: No weakness.     Coordination: Coordination normal.     Gait: Gait normal.     Deep Tendon Reflexes: Reflexes normal.  Psychiatric:        Mood and Affect: Mood normal.        Behavior: Behavior normal.        Thought Content: Thought content normal.        Judgment: Judgment normal.     Assessment/plan: Jazari Cepin is a 52 y.o. female present for CPE/routine chronic condition management Malignant neoplasm of upper-outer quadrant of right breast in female, estrogen receptor positive ([place]) -She is referred to gynecology to establish for routine cervical cancer screenings - Ambulatory referral to Gynecology  Hyperlipidemia LDL goal <100 - CBC with Differential/Platelet - Comprehensive metabolic panel - Hemoglobin A1c - Lipid panel - TSH  Colon cancer screening UTD Need for zoster vaccination Schedule nurses visit to start series Diabetes  mellitus screening - Hemoglobin A1c Encounter for hepatitis C screening test for low risk patient Declined Encounter for screening for HIV Declined  Routine general medical examination at a health care facility Patient was encouraged to exercise greater than 150 minutes a week. Patient was encouraged to choose a diet filled with fresh fruits and vegetables, and lean meats. AVS provided to patient today for education/recommendation on gender specific health and safety maintenance. Colonoscopy: Completed 10/21/2018. 10 yr Mammogram: History of breast cancer with mastectomy followed by  oncology Cervical cancer screening:referred to gyn Immunizations: Tdap up-to-date 04/2017, influenza declined, Shingrix nurse visit ok.  Infectious disease screening: HIV and hepatitis C- declined   Essential hypertension/heart failure/hyperlipidemia Stable  Follows with [facility]ology at [facility] prescribes her medications. CBC, CMP, TSH and lipids collected today  Malignant neoplasm of upper-outer quadrant of right breast in female, estrogen receptor positive ([place]) She has routine follow-ups with her specialist- which prescribe ditropan, and femara Follows routinely w/ onc.  Depression, recurrent (HCC)/Grief reaction/anxiety/sleep disturbance -Stable  -Continue Wellbutrin 300 mg daily.  -tried/failed: prozac. seroquel (sedation) and trazodone(jittery).  - could consider Effexor in place of Wellbutrin.  She may even get benefit from hot flashes from her medication with that medication. - f/u 5.5 mos.   Other migraine without status migrainosus, not intractable -Stable -Continue Imitrex -She had stopped the Elavil.   -Encouraged her to start a B complex vitamin and low-dose magnesium daily - discussed headache log and if having more frequent headaches a month we could consider preventive medication.  - MRI by Onc reviewed and reassuring.    Return in about 24 weeks (around 07/01/2023) for Routine chronic condition follow-up.  Orders Placed This Encounter  Procedures   CBC with Differential/Platelet   Comprehensive metabolic panel   Hemoglobin A1c   Lipid panel   TSH   Ambulatory referral to Gynecology    Meds ordered this encounter  Medications   buPROPion (WELLBUTRIN XL) 300 MG 24 hr tablet    Sig: Take 1 tablet (300 mg total) by mouth daily.    Dispense:  90 tablet    Refill:  1   SUMAtriptan (IMITREX) 100 MG tablet    Sig: Take 1 tablet (100 mg total) by mouth daily as needed for migraine. May rpt dose one in 2 hrs if needed    Dispense:  10 tablet    Refill:  5    atorvastatin (LIPITOR) 20 MG tablet    Sig: Take 1 tablet (20 mg total) by mouth at bedtime.    Dispense:  90 tablet    Refill:  3    Referral Orders         Ambulatory referral to Gynecology       Note is dictated utilizing voice recognition software. Although note has been proof read prior to signing, occasional typographical errors still can be missed. If any questions arise, please do not hesitate to call for verification.  Electronically signed by: Howard Pouch, DO [place]

## 2023-01-15 LAB — CBC WITH DIFFERENTIAL/PLATELET
Absolute Monocytes: 672 cells/uL (ref 200–950)
Basophils Absolute: 38 cells/uL (ref 0–200)
Basophils Relative: 0.6 %
Eosinophils Absolute: 109 cells/uL (ref 15–500)
Eosinophils Relative: 1.7 %
HCT: 40.3 % (ref 35.0–45.0)
Hemoglobin: 14.3 g/dL (ref 11.7–15.5)
Lymphs Abs: 1805 cells/uL (ref 850–3900)
MCH: 32.1 pg (ref 27.0–33.0)
MCHC: 35.5 g/dL (ref 32.0–36.0)
MCV: 90.6 fL (ref 80.0–100.0)
MPV: 10.5 fL (ref 7.5–12.5)
Monocytes Relative: 10.5 %
Neutro Abs: 3776 cells/uL (ref 1500–7800)
Neutrophils Relative %: 59 %
Platelets: 298 10*3/uL (ref 140–400)
RBC: 4.45 10*6/uL (ref 3.80–5.10)
RDW: 13 % (ref 11.0–15.0)
Total Lymphocyte: 28.2 %
WBC: 6.4 10*3/uL (ref 3.8–10.8)

## 2023-01-15 LAB — COMPREHENSIVE METABOLIC PANEL
AG Ratio: 1.4 (calc) (ref 1.0–2.5)
ALT: 19 U/L (ref 6–29)
AST: 18 U/L (ref 10–35)
Albumin: 4.5 g/dL (ref 3.6–5.1)
Alkaline phosphatase (APISO): 106 U/L (ref 37–153)
BUN: 22 mg/dL (ref 7–25)
CO2: 28 mmol/L (ref 20–32)
Calcium: 9.7 mg/dL (ref 8.6–10.4)
Chloride: 101 mmol/L (ref 98–110)
Creat: 0.92 mg/dL (ref 0.50–1.03)
Globulin: 3.3 g/dL (calc) (ref 1.9–3.7)
Glucose, Bld: 103 mg/dL — ABNORMAL HIGH (ref 65–99)
Potassium: 4.2 mmol/L (ref 3.5–5.3)
Sodium: 140 mmol/L (ref 135–146)
Total Bilirubin: 0.5 mg/dL (ref 0.2–1.2)
Total Protein: 7.8 g/dL (ref 6.1–8.1)

## 2023-01-15 LAB — HEMOGLOBIN A1C
Hgb A1c MFr Bld: 5.6 % of total Hgb (ref <5.7)
Mean Plasma Glucose: 114 mg/dL
eAG (mmol/L): 6.3 mmol/L

## 2023-01-15 LAB — LIPID PANEL
Cholesterol: 224 mg/dL — ABNORMAL HIGH (ref <200)
HDL: 77 mg/dL (ref >=50)
LDL Cholesterol (Calc): 122 mg/dL (calc) — ABNORMAL HIGH
Non-HDL Cholesterol (Calc): 147 mg/dL (calc) — ABNORMAL HIGH (ref <130)
Total CHOL/HDL Ratio: 2.9 (calc) (ref <5.0)
Triglycerides: 134 mg/dL (ref <150)

## 2023-01-15 LAB — TSH: TSH: 1.7 mIU/L

## 2023-09-11 ENCOUNTER — Other Ambulatory Visit: Payer: Self-pay

## 2023-09-11 MED ORDER — BUPROPION HCL ER (XL) 300 MG PO TB24: 300.0000 mg | ORAL_TABLET | Freq: Every day | ORAL | 0 refills | Status: DC

## 2023-10-06 DIAGNOSIS — C50411 Malignant neoplasm of upper-outer quadrant of right female breast: Secondary | ICD-10-CM | POA: Diagnosis not present

## 2023-10-06 DIAGNOSIS — Z5181 Encounter for therapeutic drug level monitoring: Secondary | ICD-10-CM | POA: Diagnosis not present

## 2023-10-06 DIAGNOSIS — Z170 Estrogen receptor positive status [ER+]: Secondary | ICD-10-CM | POA: Diagnosis not present

## 2023-10-06 DIAGNOSIS — I427 Cardiomyopathy due to drug and external agent: Secondary | ICD-10-CM | POA: Diagnosis not present

## 2023-10-06 DIAGNOSIS — R232 Flushing: Secondary | ICD-10-CM | POA: Diagnosis not present

## 2023-10-06 DIAGNOSIS — Z79811 Long term (current) use of aromatase inhibitors: Secondary | ICD-10-CM | POA: Diagnosis not present

## 2023-10-06 DIAGNOSIS — T451X5A Adverse effect of antineoplastic and immunosuppressive drugs, initial encounter: Secondary | ICD-10-CM | POA: Diagnosis not present

## 2023-11-12 ENCOUNTER — Other Ambulatory Visit: Payer: Self-pay | Admitting: Family Medicine

## 2023-11-12 NOTE — Telephone Encounter (Signed)
Copied from CRM [id number]. Topic: Clinical - Medication Refill >> Nov 12, 2023 10:20 AM Clayton Bibles wrote: Most Recent Primary Care Visit:  Provider: Felix Pacini A  Department: [facility]  Visit Type: PHYSICAL  Date: 01/14/2023  Medication: buPROPion (WELLBUTRIN XL) 300 MG 24 hr tablet  Has the patient contacted their pharmacy? No (Agent: If no, request that the patient contact the pharmacy for the refill. If patient does not wish to contact the pharmacy document the reason why and proceed with request.) (Agent: If yes, when and what did the pharmacy advise?)  Is this the correct pharmacy for this prescription? Yes [facility] If no, delete pharmacy and type the correct one.  This is the patient's preferred pharmacy:  [id number] [place] [place] [facility] [address] [place] [address] Phone: [phone] Fax: [phone]   Has the prescription been filled recently? No  Is the patient out of the medication? 1 tablet left  Has the patient been seen for an appointment in the last year OR does the patient have an upcoming appointment? Yes  Can we respond through MyChart? Yes - Please send a message when complete  Agent: Please be advised that Rx refills may take up to 3 business days. We ask that you follow-up with your pharmacy.

## 2023-12-05 ENCOUNTER — Other Ambulatory Visit: Payer: Self-pay | Admitting: Family Medicine

## 2023-12-14 ENCOUNTER — Other Ambulatory Visit: Payer: Self-pay | Admitting: Family Medicine

## 2023-12-15 ENCOUNTER — Other Ambulatory Visit: Payer: Self-pay | Admitting: Family Medicine

## 2023-12-15 NOTE — Telephone Encounter (Signed)
 Copied from CRM [id number]. Topic: Clinical - Medication Refill >> Dec 15, 2023  9:31 AM Florestine Avers wrote: Most Recent Primary Care Visit:  Provider: Felix Pacini A  Department: [facility]  Visit Type: PHYSICAL  Date: 01/14/2023  Medication: buPROPion (WELLBUTRIN XL) 300 MG 24 hr tablet   Has the patient contacted their pharmacy? Yes (Agent: If no, request that the patient contact the pharmacy for the refill. If patient does not wish to contact the pharmacy document the reason why and proceed with request.) (Agent: If yes, when and what did the pharmacy advise?)  Is this the correct pharmacy for this prescription? Yes If no, delete pharmacy and type the correct one.  This is the patient's preferred pharmacy:  [id number] [place] [place] - [address] [place] [address] Phone: [phone] Fax: [phone]   Has the prescription been filled recently? No  Is the patient out of the medication? Yes  Has the patient been seen for an appointment in the last year OR does the patient have an upcoming appointment? Yes  Can we respond through MyChart? Yes  Agent: Please be advised that Rx refills may take up to 3 business days. We ask that you follow-up with your pharmacy.

## 2023-12-15 NOTE — Telephone Encounter (Signed)
 Last Fill: 09/11/23  Last OV: 01/14/23 Next OV: None Scheduled  Routing to provider for review/authorization.

## 2023-12-17 ENCOUNTER — Other Ambulatory Visit: Payer: Self-pay

## 2023-12-17 NOTE — Telephone Encounter (Signed)
 Refill not sent. Pt needs ov

## 2023-12-19 ENCOUNTER — Ambulatory Visit: Payer: 59 | Admitting: Family Medicine

## 2023-12-19 ENCOUNTER — Encounter: Payer: Self-pay | Admitting: Family Medicine

## 2023-12-19 VITALS — BP 112/80 | HR 86 | Temp 98.0°F | Wt 148.6 lb

## 2023-12-19 DIAGNOSIS — F419 Anxiety disorder, unspecified: Secondary | ICD-10-CM

## 2023-12-19 DIAGNOSIS — I10 Essential (primary) hypertension: Secondary | ICD-10-CM | POA: Diagnosis not present

## 2023-12-19 DIAGNOSIS — D701 Agranulocytosis secondary to cancer chemotherapy: Secondary | ICD-10-CM

## 2023-12-19 DIAGNOSIS — F330 Major depressive disorder, recurrent, mild: Secondary | ICD-10-CM | POA: Diagnosis not present

## 2023-12-19 DIAGNOSIS — E785 Hyperlipidemia, unspecified: Secondary | ICD-10-CM | POA: Diagnosis not present

## 2023-12-19 DIAGNOSIS — Z23 Encounter for immunization: Secondary | ICD-10-CM

## 2023-12-19 DIAGNOSIS — Z170 Estrogen receptor positive status [ER+]: Secondary | ICD-10-CM

## 2023-12-19 DIAGNOSIS — T451X5A Adverse effect of antineoplastic and immunosuppressive drugs, initial encounter: Secondary | ICD-10-CM

## 2023-12-19 DIAGNOSIS — Z79899 Other long term (current) drug therapy: Secondary | ICD-10-CM

## 2023-12-19 DIAGNOSIS — I5022 Chronic systolic (congestive) heart failure: Secondary | ICD-10-CM

## 2023-12-19 DIAGNOSIS — C50411 Malignant neoplasm of upper-outer quadrant of right female breast: Secondary | ICD-10-CM

## 2023-12-19 LAB — LIPID PANEL
Cholesterol: 218 mg/dL — ABNORMAL HIGH (ref 0–200)
HDL: 56.7 mg/dL (ref >39.00)
LDL Cholesterol: 120 mg/dL — ABNORMAL HIGH (ref 0–99)
NonHDL: 161.24
Total CHOL/HDL Ratio: 4
Triglycerides: 205 mg/dL — ABNORMAL HIGH (ref 0.0–149.0)
VLDL: 41 mg/dL — ABNORMAL HIGH (ref 0.0–40.0)

## 2023-12-19 LAB — CBC
HCT: 40.5 % (ref 36.0–46.0)
Hemoglobin: 13.5 g/dL (ref 12.0–15.0)
MCHC: 33.3 g/dL (ref 30.0–36.0)
MCV: 95.8 fL (ref 78.0–100.0)
Platelets: 267 10*3/uL (ref 150.0–400.0)
RBC: 4.22 Mil/uL (ref 3.87–5.11)
RDW: 13.2 % (ref 11.5–15.5)
WBC: 6 10*3/uL (ref 4.0–10.5)

## 2023-12-19 LAB — COMPREHENSIVE METABOLIC PANEL
ALT: 31 U/L (ref 0–35)
AST: 23 U/L (ref 0–37)
Albumin: 4.2 g/dL (ref 3.5–5.2)
Alkaline Phosphatase: 95 U/L (ref 39–117)
BUN: 19 mg/dL (ref 6–23)
CO2: 29 meq/L (ref 19–32)
Calcium: 9.6 mg/dL (ref 8.4–10.5)
Chloride: 106 meq/L (ref 96–112)
Creatinine, Ser: 0.86 mg/dL (ref 0.40–1.20)
GFR: 77.09 mL/min (ref >60.00)
Glucose, Bld: 100 mg/dL — ABNORMAL HIGH (ref 70–99)
Potassium: 4.9 meq/L (ref 3.5–5.1)
Sodium: 142 meq/L (ref 135–145)
Total Bilirubin: 0.3 mg/dL (ref 0.2–1.2)
Total Protein: 7.2 g/dL (ref 6.0–8.3)

## 2023-12-19 LAB — TSH: TSH: 2.26 u[IU]/mL (ref 0.35–5.50)

## 2023-12-19 MED ORDER — BUPROPION HCL ER (XL) 300 MG PO TB24: 300.0000 mg | ORAL_TABLET | Freq: Every day | ORAL | 1 refills | Status: DC

## 2023-12-19 MED ORDER — SUMATRIPTAN SUCCINATE 100 MG PO TABS: 100.0000 mg | ORAL_TABLET | Freq: Every day | ORAL | 5 refills | Status: AC | PRN

## 2023-12-19 MED ORDER — ATORVASTATIN CALCIUM 20 MG PO TABS
20.0000 mg | ORAL_TABLET | Freq: Every day | ORAL | 3 refills | Status: AC
Start: 2023-12-19 — End: ?

## 2023-12-19 NOTE — Progress Notes (Signed)
 Patient ID: Carmen Shannon, female  DOB: [birth year], 53 y.o.   MRN: [id number] Patient Care Team    Relationship Specialty Notifications Start End  Natalia Leatherwood, DO PCP - General Family Medicine  05/25/19   Leland Her, MD Referring Physician Hematology and Oncology  05/25/19   Lollie Sails, [place]  Physician Assistant  05/25/19   Draeger, Michel Harrow, MD Referring Physician Orthopedic Surgery  05/25/19   Osie Cheeks, MD Referring Physician Internal Medicine  05/24/22     Chief Complaint  Patient presents with   Medical Management of Chronic Issues    Pt is fasting.      Subjective: Carmen Shannon is a 53 y.o.  female present for f/u Chronic Conditions/illness Management Migraine: She reports headaches are responding to Imitrex as needed  Depression with anxiety:  Patient presents today to discuss her depression and anxiety.   She reports overall her depression is well-managed on Wellbutrin 300 mg daily.   Essential hypertension/heart failure Follows with cardiology at [facility] prescribes her medications.     12/19/2023    8:42 AM 01/14/2023    1:22 PM 04/30/2022    8:27 AM 07/11/2021    2:10 PM 05/22/2021   11:42 AM  Depression screen PHQ 2/9  Decreased Interest 0 0 1 1 0  Down, Depressed, Hopeless 1 0 1 2 0  PHQ - 2 Score 1 0 2 3 0  Altered sleeping 1 0 1 2 0  Tired, decreased energy 0 0 1 1 0  Change in appetite 0 0 2 1 0  Feeling bad or failure about yourself  0 0 1 1 0  Trouble concentrating 0 0 1 0 0  Moving slowly or fidgety/restless 0 0 1 0 0  Suicidal thoughts 0 0 0 0 0  PHQ-9 Score 2 0 9 8 0  Difficult doing work/chores Not difficult at all          12/19/2023    8:43 AM 04/30/2022    8:28 AM 07/11/2021    2:10 PM 05/25/2019   11:19 AM  GAD 7 : Generalized Anxiety Score  Nervous, Anxious, on Edge 0 2 2 3   Control/stop worrying 0 1 2 3   Worry too much - different things 0 1 2 3   Trouble relaxing 0 1 2 3   Restless 0 1 2 3   Easily annoyed or  irritable 0 1 2 3   Afraid - awful might happen 0 1 2 3   Total GAD 7 Score 0 8 14 21   Anxiety Difficulty Not difficult at all   Very difficult          12/19/2023    8:42 AM 01/14/2023    1:22 PM  Fall Risk   Falls in the past year? 0 0  Number falls in past yr:  0  Injury with Fall?  0  Follow up Falls evaluation completed Falls evaluation completed    Immunization History  Administered Date(s) Administered   Hepatitis A 04/28/2017, 01/26/2018   Influenza Inj Mdck Quad Pf 08/05/2019   Influenza, Seasonal, Injecte, Preservative Fre 07/18/2023   Influenza,inj,Quad PF,6+ Mos 08/05/2017, 08/22/2021   Influenza-Unspecified 07/21/2020   Moderna Sars-Covid-2 Vaccination 12/27/2019, 01/20/2020   PFIZER(Purple Top)SARS-COV-2 Vaccination 10/15/2020   Pfizer Covid-19 Vaccine Bivalent Booster 23yrs & up 08/22/2021   Pfizer(Comirnaty)Fall Seasonal Vaccine 12 years and older 07/18/2023   Tdap 04/28/2017   Zoster Recombinant(Shingrix) 07/18/2023    No results found.  Past Medical History:  Diagnosis  Date   Breast cancer [facility])    CHF (congestive heart failure) (HCC) 2017   From Chemo   Chicken pox    COVID-19 05/22/2021   Depression    Migraines    Pleural effusion 02/05/2017   Right thyroid nodule 05/29/2016   Allergies  Allergen Reactions   Oxycodone-Acetaminophen Itching   Diphenhydramine Hcl Other (See Comments)    Paradoxical reaction.  "Hyper"   Past Surgical History:  Procedure Laterality Date   BREAST BIOPSY  2017   BUNIONECTOMY     CESAREAN SECTION     1997, 2001   COLONOSCOPY  2019   MASTECTOMY Bilateral 04/2019   TONSILLECTOMY AND ADENOIDECTOMY     WISDOM TOOTH EXTRACTION     Family History  Problem Relation Age of Onset   Lung cancer Mother    Alzheimer's disease Maternal Grandmother    Heart attack Maternal Grandfather    Social History   Social History Narrative   Marital status/children/pets: married. 2 living children (1 son (twin) deceased)    Education/employment: B.S. Scientist, research (physical sciences).        Allergies as of 12/19/2023       Reactions   Oxycodone-acetaminophen Itching   Diphenhydramine Hcl Other (See Comments)   Paradoxical reaction.  "Hyper"        Medication List        Accurate as of December 19, 2023  8:51 AM. If you have any questions, ask your nurse or doctor.          atorvastatin 20 MG tablet Commonly known as: LIPITOR Take 1 tablet (20 mg total) by mouth at bedtime.   buPROPion 300 MG 24 hr tablet Commonly known as: WELLBUTRIN XL Take 1 tablet (300 mg total) by mouth daily.   carvedilol 25 MG tablet Commonly known as: COREG Take by mouth 2 (two) times daily.   empagliflozin 10 MG Tabs tablet Commonly known as: JARDIANCE Take 1 tablet by mouth daily.   furosemide 20 MG tablet Commonly known as: LASIX Take by mouth as needed.   letrozole 2.5 MG tablet Commonly known as: FEMARA Take by mouth.   losartan 25 MG tablet Commonly known as: COZAAR Take 25 mg by mouth at bedtime.   SUMAtriptan 100 MG tablet Commonly known as: IMITREX Take 1 tablet (100 mg total) by mouth daily as needed for migraine. May rpt dose one in 2 hrs if needed        All past medical history, surgical history, allergies, family history, immunizations andmedications were updated in the EMR today and reviewed under the history and medication portions of their EMR.    No results found for this or any previous visit (from the past 2160 hours).  Patient was never admitted.   ROS: 14 pt review of systems performed and negative (unless mentioned in an HPI)  Objective: BP 112/80   Pulse 86   Temp 98 F (36.7 C)   Wt 148 lb 9.6 oz (67.4 kg)   SpO2 99%   BMI 27.70 kg/m  Physical Exam Vitals and nursing note reviewed.  Constitutional:      General: She is not in acute distress.    Appearance: Normal appearance. She is not ill-appearing, toxic-appearing or diaphoretic.  HENT:     Head: Normocephalic and  atraumatic.  Eyes:     General: No scleral icterus.       Right eye: No discharge.        Left eye: No discharge.  Extraocular Movements: Extraocular movements intact.     Conjunctiva/sclera: Conjunctivae normal.     Pupils: Pupils are equal, round, and reactive to light.  Cardiovascular:     Rate and Rhythm: Normal rate and regular rhythm.  Pulmonary:     Effort: Pulmonary effort is normal. No respiratory distress.     Breath sounds: Normal breath sounds. No wheezing, rhonchi or rales.  Musculoskeletal:     Right lower leg: No edema.     Left lower leg: No edema.  Skin:    General: Skin is warm.     Findings: No rash.  Neurological:     Mental Status: She is alert and oriented to person, place, and time. Mental status is at baseline.     Motor: No weakness.     Gait: Gait normal.  Psychiatric:        Mood and Affect: Mood normal.        Behavior: Behavior normal.        Thought Content: Thought content normal.        Judgment: Judgment normal.     Assessment/plan: Carmen Shannon is a 53 y.o. female present for chronic condition management  Essential hypertension/heart failure/hyperlipidemia Stable Follows with cardiology at [facility] prescribes her medications, except for statin Continue Lipitor 20 mg in the evening Labs due next visit  Malignant neoplasm of upper-outer quadrant of right breast in female, estrogen receptor positive (HCC) She has routine follow-ups with her specialist- which prescribe ditropan, and femara Follows routinely w/ onc.  Depression, recurrent (HCC)/Grief reaction/anxiety/sleep disturbance Stable Continue Wellbutrin 300 mg daily.  -tried/failed: prozac. seroquel (sedation) and trazodone(jittery).  - could consider Effexor in place of Wellbutrin.  She may even get benefit from hot flashes from her medication with that medication.  Other migraine without status migrainosus, not intractable Stable Continue Imitrex -She had stopped the  Elavil.   -Encouraged her to start a B complex vitamin and low-dose magnesium daily - discussed headache log and if having more frequent headaches a month we could consider preventive medication.  - MRI by Onc reviewed and reassuring.    Return in about 24 weeks (around 06/04/2024) for Routine chronic condition follow-up.   Orders Placed This Encounter  Procedures   Comp Met (CMET)   Lipid panel   TSH   CBC   Ambulatory referral to Gynecology    Meds ordered this encounter  Medications   atorvastatin (LIPITOR) 20 MG tablet    Sig: Take 1 tablet (20 mg total) by mouth at bedtime.    Dispense:  90 tablet    Refill:  3   SUMAtriptan (IMITREX) 100 MG tablet    Sig: Take 1 tablet (100 mg total) by mouth daily as needed for migraine. May rpt dose one in 2 hrs if needed    Dispense:  10 tablet    Refill:  5   buPROPion (WELLBUTRIN XL) 300 MG 24 hr tablet    Sig: Take 1 tablet (300 mg total) by mouth daily.    Dispense:  90 tablet    Refill:  1    Referral Orders         Ambulatory referral to Gynecology        Note is dictated utilizing voice recognition software. Although note has been proof read prior to signing, occasional typographical errors still can be missed. If any questions arise, please do not hesitate to call for verification.  Electronically signed by: Felix Pacini, DO [facility] [place]

## 2023-12-19 NOTE — Patient Instructions (Addendum)

## 2023-12-22 ENCOUNTER — Encounter: Payer: Self-pay | Admitting: Family Medicine

## 2024-01-19 DIAGNOSIS — Z780 Asymptomatic menopausal state: Secondary | ICD-10-CM | POA: Diagnosis not present

## 2024-01-19 DIAGNOSIS — Z13820 Encounter for screening for osteoporosis: Secondary | ICD-10-CM | POA: Diagnosis not present

## 2024-01-19 DIAGNOSIS — M8589 Other specified disorders of bone density and structure, multiple sites: Secondary | ICD-10-CM | POA: Diagnosis not present

## 2024-02-16 DIAGNOSIS — Z1151 Encounter for screening for human papillomavirus (HPV): Secondary | ICD-10-CM | POA: Diagnosis not present

## 2024-02-16 DIAGNOSIS — R635 Abnormal weight gain: Secondary | ICD-10-CM | POA: Diagnosis not present

## 2024-02-16 DIAGNOSIS — Z6828 Body mass index (BMI) 28.0-28.9, adult: Secondary | ICD-10-CM | POA: Diagnosis not present

## 2024-02-16 DIAGNOSIS — Z01419 Encounter for gynecological examination (general) (routine) without abnormal findings: Secondary | ICD-10-CM | POA: Diagnosis not present

## 2024-02-16 DIAGNOSIS — Z124 Encounter for screening for malignant neoplasm of cervix: Secondary | ICD-10-CM | POA: Diagnosis not present

## 2024-02-16 DIAGNOSIS — N951 Menopausal and female climacteric states: Secondary | ICD-10-CM | POA: Diagnosis not present

## 2024-02-23 DIAGNOSIS — Z170 Estrogen receptor positive status [ER+]: Secondary | ICD-10-CM | POA: Diagnosis not present

## 2024-02-23 DIAGNOSIS — Z79811 Long term (current) use of aromatase inhibitors: Secondary | ICD-10-CM | POA: Diagnosis not present

## 2024-02-23 DIAGNOSIS — C50411 Malignant neoplasm of upper-outer quadrant of right female breast: Secondary | ICD-10-CM | POA: Diagnosis not present

## 2024-02-23 DIAGNOSIS — Z5181 Encounter for therapeutic drug level monitoring: Secondary | ICD-10-CM | POA: Diagnosis not present

## 2024-03-24 ENCOUNTER — Encounter: Payer: Self-pay | Admitting: Family Medicine

## 2024-03-24 ENCOUNTER — Ambulatory Visit: Payer: Self-pay | Admitting: Family Medicine

## 2024-03-24 ENCOUNTER — Ambulatory Visit: Admitting: Family Medicine

## 2024-03-24 ENCOUNTER — Ambulatory Visit (HOSPITAL_BASED_OUTPATIENT_CLINIC_OR_DEPARTMENT_OTHER)
Admission: RE | Admit: 2024-03-24 | Discharge: 2024-03-24 | Disposition: A | Discharge status: Home / Self Care | Source: Ambulatory Visit | Attending: Family Medicine | Admitting: Family Medicine

## 2024-03-24 VITALS — BP 114/72 | HR 87 | Temp 98.0°F | Wt 146.2 lb

## 2024-03-24 DIAGNOSIS — M779 Enthesopathy, unspecified: Secondary | ICD-10-CM | POA: Diagnosis not present

## 2024-03-24 DIAGNOSIS — M79644 Pain in right finger(s): Secondary | ICD-10-CM | POA: Insufficient documentation

## 2024-03-24 NOTE — Patient Instructions (Addendum)
 Solid Thumb Spica Splint  [facility] Address: [address], [place], [place] [address] Phone: [phone]

## 2024-03-24 NOTE — Progress Notes (Signed)
 Carmen Shannon , 1971-07-23, 53 y.o., female MRN: [id number] Patient Care Team    Relationship Specialty Notifications Start End  Mariel Shope, DO PCP - General Family Medicine  05/25/19   Hoyt Macleod, MD Referring Physician Hematology and Oncology  05/25/19   Courtland Ditch, [place]  Physician Assistant  05/25/19   Draeger, Melba Spittle, MD Referring Physician Orthopedic Surgery  05/25/19   Gillis Ladd, MD Referring Physician Internal Medicine  05/24/22     Chief Complaint  Patient presents with   Hand Pain    Pt fell 3 weeks ago causing severe pain to her R thumb. Pt states pain has since improved but she still has trouble holding certain things. Pt has not taken anything to relieve pain.      Subjective: Carmen Shannon is a 53 y.o. Pt presents for an OV with complaints of right thumb pain after fall coming out the pool  of 3 weeks duration.  Associated symptoms include pain with moving and attempting to hold objects. She is not certain the MOI to her thumb. She slipped and fell to her knees, twisted left ankle also.  Pt has tried nothing to ease their symptoms.      12/19/2023    8:42 AM 01/14/2023    1:22 PM 04/30/2022    8:27 AM 07/11/2021    2:10 PM 05/22/2021   11:42 AM  Depression screen PHQ 2/9  Decreased Interest 0 0 1 1 0  Down, Depressed, Hopeless 1 0 1 2 0  PHQ - 2 Score 1 0 2 3 0  Altered sleeping 1 0 1 2 0  Tired, decreased energy 0 0 1 1 0  Change in appetite 0 0 2 1 0  Feeling bad or failure about yourself  0 0 1 1 0  Trouble concentrating 0 0 1 0 0  Moving slowly or fidgety/restless 0 0 1 0 0  Suicidal thoughts 0 0 0 0 0  PHQ-9 Score 2 0 9 8 0  Difficult doing work/chores Not difficult at all        Allergies  Allergen Reactions   Oxycodone -Acetaminophen  Itching   Diphenhydramine Hcl Other (See Comments)    Paradoxical reaction.  "Hyper"   Social History   Social History Narrative   Marital status/children/pets: married. 2 living children (1 son  (twin) deceased)   Education/employment: B.S. Scientist, research (physical sciences).       Past Medical History:  Diagnosis Date   Breast cancer (HCC)    CHF (congestive heart failure) (HCC) 2017   From Chemo   Chicken pox    COVID-19 05/22/2021   Depression    Migraines    Pleural effusion 02/05/2017   Right thyroid  nodule 05/29/2016   Past Surgical History:  Procedure Laterality Date   BREAST BIOPSY  2017   BUNIONECTOMY     CESAREAN SECTION     1997, 2001   COLONOSCOPY  2019   MASTECTOMY Bilateral 04/2019   TONSILLECTOMY AND ADENOIDECTOMY     WISDOM TOOTH EXTRACTION     Family History  Problem Relation Age of Onset   Lung cancer Mother    Alzheimer's disease Maternal Grandmother    Heart attack Maternal Grandfather    Allergies as of 03/24/2024       Reactions   Oxycodone -acetaminophen  Itching   Diphenhydramine Hcl Other (See Comments)   Paradoxical reaction.  "Hyper"        Medication List  Accurate as of March 24, 2024  9:51 AM. If you have any questions, ask your nurse or doctor.          atorvastatin  20 MG tablet Commonly known as: LIPITOR Take 1 tablet (20 mg total) by mouth at bedtime.   buPROPion  300 MG 24 hr tablet Commonly known as: WELLBUTRIN  XL Take 1 tablet (300 mg total) by mouth daily.   carvedilol 25 MG tablet Commonly known as: COREG Take by mouth 2 (two) times daily.   empagliflozin 10 MG Tabs tablet Commonly known as: JARDIANCE Take 1 tablet by mouth daily.   furosemide 20 MG tablet Commonly known as: LASIX Take by mouth as needed.   letrozole 2.5 MG tablet Commonly known as: FEMARA Take by mouth.   losartan 25 MG tablet Commonly known as: COZAAR Take 25 mg by mouth at bedtime.   SUMAtriptan  100 MG tablet Commonly known as: IMITREX  Take 1 tablet (100 mg total) by mouth daily as needed for migraine. May rpt dose one in 2 hrs if needed        All past medical history, surgical history, allergies, family history, immunizations  andmedications were updated in the EMR today and reviewed under the history and medication portions of their EMR.     ROS Negative, with the exception of above mentioned in HPI   Objective:  BP 114/72   Pulse 87   Temp 98 F (36.7 C)   Wt 146 lb 3.2 oz (66.3 kg)   SpO2 97%   BMI 27.25 kg/m  Body mass index is 27.25 kg/m. Physical Exam Vitals and nursing note reviewed.  Constitutional:      General: She is not in acute distress.    Appearance: Normal appearance. She is normal weight. She is not ill-appearing or toxic-appearing.  HENT:     Head: Normocephalic and atraumatic.  Eyes:     General: No scleral icterus.       Right eye: No discharge.        Left eye: No discharge.     Extraocular Movements: Extraocular movements intact.     Conjunctiva/sclera: Conjunctivae normal.     Pupils: Pupils are equal, round, and reactive to light.  Musculoskeletal:        General: Tenderness present.     Comments: Right thumb: no erythema or bruising. Skin intact. No swelling. Flexion/ext intact with discomfort. +finklestein. TTP mid shaft dorsal thumb over tendon sheath and bone TTP PIP joint. NV intact.  Skin:    Findings: No rash.  Neurological:     Mental Status: She is alert and oriented to person, place, and time. Mental status is at baseline.     Motor: No weakness.     Coordination: Coordination normal.     Gait: Gait normal.  Psychiatric:        Mood and Affect: Mood normal.        Behavior: Behavior normal.        Thought Content: Thought content normal.        Judgment: Judgment normal.      No results found. No results found. No results found for this or any previous visit (from the past 24 hours).  Assessment/Plan: Carmen Shannon is a 53 y.o. female present for OV for  Thumb pain, right (Primary)/Tendonitis Pain midshaft btw cmc and PIP, with bone tenderness at PIP joint.  Tendon is intact and functioning, possible partial tear or strain with fall.  NSAIDS and  thumb spicca splint recs for  2-4 weeks Xray today to rule out fx/avulsion at joint.  - DG Finger Thumb Right; Future F/u 4 weeks if needed only   Reviewed expectations re: course of current medical issues. Discussed self-management of symptoms. Outlined signs and symptoms indicating need for more acute intervention. Patient verbalized understanding and all questions were answered. Patient received an After-Visit Summary.    Orders Placed This Encounter  Procedures   DG Finger Thumb Right   No orders of the defined types were placed in this encounter.  Referral Orders  No referral(s) requested today     Note is dictated utilizing voice recognition software. Although note has been proof read prior to signing, occasional typographical errors still can be missed. If any questions arise, please do not hesitate to call for verification.   electronically signed by:  Napolean Backbone, DO  [facility] [place]

## 2024-03-25 ENCOUNTER — Ambulatory Visit: Admitting: Podiatry

## 2024-03-25 DIAGNOSIS — M2041 Other hammer toe(s) (acquired), right foot: Secondary | ICD-10-CM

## 2024-04-30 ENCOUNTER — Ambulatory Visit: Payer: Self-pay

## 2024-04-30 NOTE — Telephone Encounter (Signed)
 FYI Only or Action Required?: FYI only for provider.  Patient was last seen in primary care on 03/24/2024 by Catherine Fuller A, DO.  Called Nurse Triage reporting Conjunctivitis.  Symptoms began a week ago.  Interventions attempted: Nothing.  Symptoms are: stable.  Triage Disposition: See PCP When Office is Open (Within 3 Days) see note.  Patient/caregiver understands and will follow disposition?: Yes       Reason for Disposition  [1] Only 1 eye is red AND [2] present > 48 hours  Answer Assessment - Initial Assessment Questions 1. LOCATION: Location: What's red, the eyeball or the outer eyelids? (Note: when callers say the eye is red, they usually mean the sclera is red)       ----- Right eye    2. REDNESS OF SCLERA: Is the redness in one or both eyes? When did the redness start?      ------- Yes   3. ONSET: When did the eye become red? (e.g., hours, days)      ----------------- one week    4. EYELIDS: Are the eyelids red or swollen? If Yes, ask: How much?      -----Denies    5. VISION: Is there any difficulty seeing clearly?      ---Denies    6. ITCHIG: Does it feel itchy? If so ask: How bad is it (e.g., Scale 1-10; or mild, moderate, severe)     ----------- Denies    7. PAIN: Is there any pain? If Yes, ask: How bad is it? (e.g., Scale 1-10; or mild, moderate, severe)     ---- Denies   8. CONTACT LENS: Do you wear contacts?     ------Denies   9. CAUSE: What do you think is causing the redness?     ------------------ Denies    10. OTHER SYMPTOMS: Do you have any other symptoms? (e.g., fever, runny nose, cough, vomiting)       ------- Denies    Additional Information: Patient notes its been on and off for about 1 week.  Denies any injury to the area.   Attempted to schedule an appt within dispo timeframe, none noted. Referred pt to local UC. Patient educated on pertinent s/s that would warrant emergent help/call 911/  ED Patient verbalized understanding and agrees with plan No additional questions/concerns noted during the time of the call.  Protocols used: Eye - Red Without Pus-A-AH

## 2024-04-30 NOTE — Telephone Encounter (Signed)
 FYI reviewed

## 2024-04-30 NOTE — Telephone Encounter (Signed)
 FYI Only or Action Required?: FYI only for provider.  Patient was last seen in primary care on 03/24/2024 by Catherine Fuller A, DO.  Called Nurse Triage reporting No chief complaint on file..  Contacted patient at this time, no answer, left a voicemail.

## 2024-05-10 DIAGNOSIS — Z6826 Body mass index (BMI) 26.0-26.9, adult: Secondary | ICD-10-CM | POA: Diagnosis not present

## 2024-05-10 DIAGNOSIS — Z713 Dietary counseling and surveillance: Secondary | ICD-10-CM | POA: Diagnosis not present

## 2024-05-20 NOTE — Patient Instructions (Incomplete)

## 2024-05-20 NOTE — Progress Notes (Deleted)
 Carmen Shannon , 1971/08/31, 53 y.o., female MRN: [id number] Patient Care Team    Relationship Specialty Notifications Start End  Catherine Charlies LABOR, DO PCP - General Family Medicine  05/25/19   Nick Recardo Neptune, MD Referring Physician Hematology and Oncology  05/25/19   Girard Devere CROME, [place]  Physician Assistant  05/25/19   Draeger, Robynn ORN, MD Referring Physician Orthopedic Surgery  05/25/19   Royce Almarie HERO, MD Referring Physician Internal Medicine  05/24/22     No chief complaint on file.    Subjective: Carmen Shannon is a 53 y.o. Pt presents for an OV with complaints of ear pain.***     12/19/2023    8:42 AM 01/14/2023    1:22 PM 04/30/2022    8:27 AM 07/11/2021    2:10 PM 05/22/2021   11:42 AM  Depression screen PHQ 2/9  Decreased Interest 0 0 1 1 0  Down, Depressed, Hopeless 1 0 1 2 0  PHQ - 2 Score 1 0 2 3 0  Altered sleeping 1 0 1 2 0  Tired, decreased energy 0 0 1 1 0  Change in appetite 0 0 2 1 0  Feeling bad or failure about yourself  0 0 1 1 0  Trouble concentrating 0 0 1 0 0  Moving slowly or fidgety/restless 0 0 1 0 0  Suicidal thoughts 0 0 0 0 0  PHQ-9 Score 2 0 9 8 0  Difficult doing work/chores Not difficult at all        Allergies  Allergen Reactions   Oxycodone -Acetaminophen  Itching   Diphenhydramine Hcl Other (See Comments)    Paradoxical reaction.  Hyper   Social History   Social History Narrative   Marital status/children/pets: married. 2 living children (1 son (twin) deceased)   Education/employment: B.S. Scientist, research (physical sciences).       Past Medical History:  Diagnosis Date   Breast cancer (HCC)    CHF (congestive heart failure) (HCC) 2017   From Chemo   Chicken pox    COVID-19 05/22/2021   Depression    Migraines    Pleural effusion 02/05/2017   Right thyroid  nodule 05/29/2016   Past Surgical History:  Procedure Laterality Date   BREAST BIOPSY  2017   BUNIONECTOMY     CESAREAN SECTION     1997, 2001   COLONOSCOPY  2019   MASTECTOMY  Bilateral 04/2019   TONSILLECTOMY AND ADENOIDECTOMY     WISDOM TOOTH EXTRACTION     Family History  Problem Relation Age of Onset   Lung cancer Mother    Alzheimer's disease Maternal Grandmother    Heart attack Maternal Grandfather    Allergies as of 05/21/2024       Reactions   Oxycodone -acetaminophen  Itching   Diphenhydramine Hcl Other (See Comments)   Paradoxical reaction.  Hyper        Medication List        Accurate as of May 20, 2024 12:51 PM. If you have any questions, ask your nurse or doctor.          atorvastatin  20 MG tablet Commonly known as: LIPITOR Take 1 tablet (20 mg total) by mouth at bedtime.   buPROPion  300 MG 24 hr tablet Commonly known as: WELLBUTRIN  XL Take 1 tablet (300 mg total) by mouth daily.   carvedilol 25 MG tablet Commonly known as: COREG Take by mouth 2 (two) times daily.   empagliflozin 10 MG Tabs tablet Commonly known as:  JARDIANCE Take 1 tablet by mouth daily.   furosemide 20 MG tablet Commonly known as: LASIX Take by mouth as needed.   letrozole 2.5 MG tablet Commonly known as: FEMARA Take by mouth.   losartan 25 MG tablet Commonly known as: COZAAR Take 25 mg by mouth at bedtime.   SUMAtriptan  100 MG tablet Commonly known as: IMITREX  Take 1 tablet (100 mg total) by mouth daily as needed for migraine. May rpt dose one in 2 hrs if needed        All past medical history, surgical history, allergies, family history, immunizations andmedications were updated in the EMR today and reviewed under the history and medication portions of their EMR.     ROS Negative, with the exception of above mentioned in HPI   Objective:  There were no vitals taken for this visit. There is no height or weight on file to calculate BMI.  Physical Exam   No results found. No results found. No results found for this or any previous visit (from the past 24 hours).  Assessment/Plan: Carmen Shannon is a 53 y.o. female present for  OV for  *** Reviewed expectations re: course of current medical issues. Discussed self-management of symptoms. Outlined signs and symptoms indicating need for more acute intervention. Patient verbalized understanding and all questions were answered. Patient received an After-Visit Summary.    No orders of the defined types were placed in this encounter.  No orders of the defined types were placed in this encounter.  Referral Orders  No referral(s) requested today     Note is dictated utilizing voice recognition software. Although note has been proof read prior to signing, occasional typographical errors still can be missed. If any questions arise, please do not hesitate to call for verification.   electronically signed by:  Charlies Bellini, DO  [facility] [place]

## 2024-05-21 ENCOUNTER — Telehealth: Payer: Self-pay

## 2024-05-21 ENCOUNTER — Ambulatory Visit: Admitting: Family Medicine

## 2024-05-21 NOTE — Telephone Encounter (Signed)
 Mychart sent to pt regarding PCP absence/appt.

## 2024-05-26 ENCOUNTER — Ambulatory Visit: Admitting: Family Medicine

## 2024-05-26 ENCOUNTER — Encounter: Payer: Self-pay | Admitting: Family Medicine

## 2024-05-26 VITALS — BP 112/76 | HR 82 | Temp 98.2°F | Wt 134.6 lb

## 2024-05-26 DIAGNOSIS — H9201 Otalgia, right ear: Secondary | ICD-10-CM | POA: Diagnosis not present

## 2024-05-26 DIAGNOSIS — H159 Unspecified disorder of sclera: Secondary | ICD-10-CM | POA: Insufficient documentation

## 2024-05-26 DIAGNOSIS — I10 Essential (primary) hypertension: Secondary | ICD-10-CM

## 2024-05-26 DIAGNOSIS — H6991 Unspecified Eustachian tube disorder, right ear: Secondary | ICD-10-CM | POA: Diagnosis not present

## 2024-05-26 DIAGNOSIS — E785 Hyperlipidemia, unspecified: Secondary | ICD-10-CM

## 2024-05-26 DIAGNOSIS — F419 Anxiety disorder, unspecified: Secondary | ICD-10-CM | POA: Diagnosis not present

## 2024-05-26 DIAGNOSIS — F330 Major depressive disorder, recurrent, mild: Secondary | ICD-10-CM

## 2024-05-26 DIAGNOSIS — I5022 Chronic systolic (congestive) heart failure: Secondary | ICD-10-CM

## 2024-05-26 MED ORDER — BUPROPION HCL ER (XL) 300 MG PO TB24: 300.0000 mg | ORAL_TABLET | Freq: Every day | ORAL | 1 refills | Status: AC

## 2024-05-26 MED ORDER — KETOROLAC TROMETHAMINE 0.5 % OP SOLN: 1.0000 [drp] | Freq: Three times a day (TID) | OPHTHALMIC | 0 refills | Status: AC

## 2024-05-26 NOTE — Progress Notes (Signed)
 Carmen Shannon , [birth year], 53 y.o., female MRN: [id number] Patient Care Team    Relationship Specialty Notifications Start End  Catherine Charlies LABOR, DO PCP - General Family Medicine  05/25/19   Nick Recardo Neptune, MD Referring Physician Hematology and Oncology  05/25/19   Girard Devere CROME, [place]  Physician Assistant  05/25/19   Draeger, Robynn ORN, MD Referring Physician Orthopedic Surgery  05/25/19   Royce Almarie HERO, MD Referring Physician Internal Medicine  05/24/22     Chief Complaint  Patient presents with   Ear Pain    R ear; started to hurt after swimming. Ear pain as improved. Pt also mention R eye pain; redness. Pt has taken tylenol .      Subjective: Carmen Shannon is a 53 y.o. Pt presents for an OV with complaints of new right ear pain of > 1 week duration.  Associated symptoms include feeling like her ears need to pop. She had a sharp pain in right ear, took a tylenol  and sharp pain resolved. Still sounds muffled. No fever or chills.   She also complains right eye redness on the medial aspect of her right eye. She states it is not painful, itchy or dry feeling. She would not notice it, except other people notice it and ask her about it. It has been present for about 3 months or more. No injury and no changes in vision.   Migraine: She reports headaches are responding to Imitrex  as needed  Depression with anxiety:  Patient presents today to discuss her depression and anxiety.   She reports overall her depression is well managed on Wellbutrin  300 mg daily.   Essential hypertension/heart failure Follows with cardiology at [facility] prescribes her medications.     12/19/2023    8:42 AM 01/14/2023    1:22 PM 04/30/2022    8:27 AM 07/11/2021    2:10 PM 05/22/2021   11:42 AM  Depression screen PHQ 2/9  Decreased Interest 0 0 1 1 0  Down, Depressed, Hopeless 1 0 1 2 0  PHQ - 2 Score 1 0 2 3 0  Altered sleeping 1 0 1 2 0  Tired, decreased energy 0 0 1 1 0  Change in appetite 0 0 2 1  0  Feeling bad or failure about yourself  0 0 1 1 0  Trouble concentrating 0 0 1 0 0  Moving slowly or fidgety/restless 0 0 1 0 0  Suicidal thoughts 0 0 0 0 0  PHQ-9 Score 2 0 9 8 0  Difficult doing work/chores Not difficult at all        Allergies  Allergen Reactions   Oxycodone -Acetaminophen  Itching   Diphenhydramine Hcl Other (See Comments)    Paradoxical reaction.  Hyper   Social History   Social History Narrative   Marital status/children/pets: married. 2 living children (1 son (twin) deceased)   Education/employment: B.S. Scientist, research (physical sciences).       Past Medical History:  Diagnosis Date   Breast cancer (HCC)    CHF (congestive heart failure) (HCC) 2017   From Chemo   Chicken pox    COVID-19 05/22/2021   Depression    Migraines    Pleural effusion 02/05/2017   Right thyroid  nodule 05/29/2016   Past Surgical History:  Procedure Laterality Date   BREAST BIOPSY  2017   BUNIONECTOMY     CESAREAN SECTION     1997, 2001   COLONOSCOPY  2019   MASTECTOMY Bilateral 04/2019  TONSILLECTOMY AND ADENOIDECTOMY     WISDOM TOOTH EXTRACTION     Family History  Problem Relation Age of Onset   Lung cancer Mother    Alzheimer's disease Maternal Grandmother    Heart attack Maternal Grandfather    Allergies as of 05/26/2024       Reactions   Oxycodone -acetaminophen  Itching   Diphenhydramine Hcl Other (See Comments)   Paradoxical reaction.  Hyper        Medication List        Accurate as of May 26, 2024 11:29 AM. If you have any questions, ask your nurse or doctor.          atorvastatin  20 MG tablet Commonly known as: LIPITOR Take 1 tablet (20 mg total) by mouth at bedtime.   buPROPion  300 MG 24 hr tablet Commonly known as: WELLBUTRIN  XL Take 1 tablet (300 mg total) by mouth daily.   carvedilol 25 MG tablet Commonly known as: COREG Take by mouth 2 (two) times daily.   empagliflozin 10 MG Tabs tablet Commonly known as: JARDIANCE Take 1 tablet by mouth  daily.   furosemide 20 MG tablet Commonly known as: LASIX Take by mouth as needed.   ketorolac  0.5 % ophthalmic solution Commonly known as: ACULAR  Place 1 drop into the right eye 3 (three) times daily for 7 days. Started by: Charlies Bellini   letrozole 2.5 MG tablet Commonly known as: FEMARA Take by mouth.   losartan 25 MG tablet Commonly known as: COZAAR Take 25 mg by mouth at bedtime.   SUMAtriptan  100 MG tablet Commonly known as: IMITREX  Take 1 tablet (100 mg total) by mouth daily as needed for migraine. May rpt dose one in 2 hrs if needed        All past medical history, surgical history, allergies, family history, immunizations andmedications were updated in the EMR today and reviewed under the history and medication portions of their EMR.     ROS Negative, with the exception of above mentioned in HPI   Objective:  BP 112/76   Pulse 82   Temp 98.2 F (36.8 C)   Wt 134 lb 9.6 oz (61.1 kg)   SpO2 99%   BMI 25.09 kg/m  Body mass index is 25.09 kg/m.  Physical Exam Vitals and nursing note reviewed.  Constitutional:      General: She is not in acute distress.    Appearance: Normal appearance. She is normal weight. She is not ill-appearing or toxic-appearing.  HENT:     Head: Normocephalic and atraumatic.     Right Ear: Ear canal and external ear normal. A middle ear effusion is present. There is no impacted cerumen. No mastoid tenderness. Tympanic membrane is not injected, perforated, erythematous, retracted or bulging.     Left Ear: Ear canal and external ear normal. A middle ear effusion is present. There is no impacted cerumen. No mastoid tenderness. Tympanic membrane is not injected, perforated, erythematous, retracted or bulging.     Ears:     Comments: Mild ear effusion b/l    Nose: Nose normal.     Right Turbinates: Not enlarged or swollen.     Left Turbinates: Not enlarged or swollen.     Right Sinus: No maxillary sinus tenderness.     Left Sinus: No  maxillary sinus tenderness.     Mouth/Throat:     Mouth: Mucous membranes are moist.     Pharynx: No oropharyngeal exudate or posterior oropharyngeal erythema.  Eyes:  General: Lids are normal. Vision grossly intact. Gaze aligned appropriately. No visual field deficit or scleral icterus.       Right eye: No discharge.        Left eye: No discharge.     Extraocular Movements: Extraocular movements intact.     Right eye: Normal extraocular motion and no nystagmus.     Conjunctiva/sclera:     Right eye: Right conjunctiva is injected. Chemosis present. No exudate or hemorrhage.    Pupils: Pupils are equal, round, and reactive to light.   Cardiovascular:     Rate and Rhythm: Normal rate.  Pulmonary:     Effort: Pulmonary effort is normal.  Musculoskeletal:     Cervical back: Neck supple.  Lymphadenopathy:     Cervical: No cervical adenopathy.  Skin:    Findings: No rash.  Neurological:     Mental Status: She is alert and oriented to person, place, and time. Mental status is at baseline.     Motor: No weakness.     Coordination: Coordination normal.     Gait: Gait normal.  Psychiatric:        Mood and Affect: Mood normal.        Behavior: Behavior normal.        Thought Content: Thought content normal.        Judgment: Judgment normal.      No results found. No results found. No results found for this or any previous visit (from the past 24 hours).  Assessment/Plan: Carmen Shannon is a 53 y.o. female present for OV for  Essential hypertension/heart failure/hyperlipidemia Stable Follows with cardiology at [facility] prescribes her medications, except for statin Continue Lipitor 20 mg in the evening  Malignant neoplasm of upper-outer quadrant of right breast in female, estrogen receptor positive (HCC) She has routine follow-ups with her specialist- which prescribe ditropan, and femara Follows routinely w/ onc.  Depression, recurrent (HCC)/Grief reaction/anxiety/sleep  disturbance Stable Continue Wellbutrin  300 mg daily.  -tried/failed: prozac . seroquel  (sedation) and trazodone (jittery).  - could consider Effexor in place of Wellbutrin .  She may even get benefit from hot flashes from her medication with that medication.  Other migraine without status migrainosus, not intractable Stable continue  Imitrex  -She had stopped the Elavil .   -Encouraged her to start a B complex vitamin and low-dose magnesium daily - discussed headache log and if having more frequent headaches a month we could consider preventive medication.  - MRI by Onc reviewed and reassuring.   Otalgia of right ear (Primary)/Eustachian tube dysfx: OTC Flonase recommended daily  Scleral lesion Chemosis or small cystic structure present, otherwise asymptomatic.  Ketorlac OP TID for 7 days.  Referral to ophth for further eval - Ambulatory referral to Ophthalmology  Reviewed expectations re: course of current medical issues. Discussed self-management of symptoms. Outlined signs and symptoms indicating need for more acute intervention. Patient verbalized understanding and all questions were answered. Patient received an After-Visit Summary.    Orders Placed This Encounter  Procedures   Ambulatory referral to Ophthalmology   Meds ordered this encounter  Medications   ketorolac  (ACULAR ) 0.5 % ophthalmic solution    Sig: Place 1 drop into the right eye 3 (three) times daily for 7 days.    Dispense:  3 mL    Refill:  0   buPROPion  (WELLBUTRIN  XL) 300 MG 24 hr tablet    Sig: Take 1 tablet (300 mg total) by mouth daily.    Dispense:  90 tablet    Refill:  1   Referral Orders         Ambulatory referral to Ophthalmology       Note is dictated utilizing voice recognition software. Although note has been proof read prior to signing, occasional typographical errors still can be missed. If any questions arise, please do not hesitate to call for verification.   electronically signed  by:  Charlies Bellini, DO  [facility] [place]

## 2024-06-16 DIAGNOSIS — H25013 Cortical age-related cataract, bilateral: Secondary | ICD-10-CM | POA: Diagnosis not present

## 2024-06-16 DIAGNOSIS — H11151 Pinguecula, right eye: Secondary | ICD-10-CM | POA: Diagnosis not present

## 2024-06-30 DIAGNOSIS — C50411 Malignant neoplasm of upper-outer quadrant of right female breast: Secondary | ICD-10-CM | POA: Diagnosis not present

## 2024-06-30 DIAGNOSIS — Z5181 Encounter for therapeutic drug level monitoring: Secondary | ICD-10-CM | POA: Diagnosis not present

## 2024-06-30 DIAGNOSIS — Z79811 Long term (current) use of aromatase inhibitors: Secondary | ICD-10-CM | POA: Diagnosis not present

## 2024-06-30 DIAGNOSIS — T451X5A Adverse effect of antineoplastic and immunosuppressive drugs, initial encounter: Secondary | ICD-10-CM | POA: Diagnosis not present

## 2024-06-30 DIAGNOSIS — I427 Cardiomyopathy due to drug and external agent: Secondary | ICD-10-CM | POA: Diagnosis not present

## 2024-06-30 DIAGNOSIS — R232 Flushing: Secondary | ICD-10-CM | POA: Diagnosis not present

## 2024-06-30 DIAGNOSIS — Z170 Estrogen receptor positive status [ER+]: Secondary | ICD-10-CM | POA: Diagnosis not present

## 2024-09-27 ENCOUNTER — Ambulatory Visit: Payer: Self-pay | Admitting: Family Medicine

## 2024-09-27 ENCOUNTER — Ambulatory Visit (HOSPITAL_BASED_OUTPATIENT_CLINIC_OR_DEPARTMENT_OTHER)
Admission: RE | Admit: 2024-09-27 | Discharge: 2024-09-27 | Disposition: A | Source: Ambulatory Visit | Attending: Family Medicine | Admitting: Family Medicine

## 2024-09-27 ENCOUNTER — Encounter: Payer: Self-pay | Admitting: Family Medicine

## 2024-09-27 ENCOUNTER — Ambulatory Visit: Admitting: Family Medicine

## 2024-09-27 VITALS — BP 108/70 | HR 102 | Temp 98.2°F | Wt 130.4 lb

## 2024-09-27 DIAGNOSIS — R059 Cough, unspecified: Secondary | ICD-10-CM | POA: Diagnosis not present

## 2024-09-27 DIAGNOSIS — C50411 Malignant neoplasm of upper-outer quadrant of right female breast: Secondary | ICD-10-CM | POA: Diagnosis not present

## 2024-09-27 DIAGNOSIS — R000 Tachycardia, unspecified: Secondary | ICD-10-CM

## 2024-09-27 DIAGNOSIS — R918 Other nonspecific abnormal finding of lung field: Secondary | ICD-10-CM

## 2024-09-27 DIAGNOSIS — R0989 Other specified symptoms and signs involving the circulatory and respiratory systems: Secondary | ICD-10-CM | POA: Diagnosis not present

## 2024-09-27 DIAGNOSIS — Z170 Estrogen receptor positive status [ER+]: Secondary | ICD-10-CM

## 2024-09-27 DIAGNOSIS — R051 Acute cough: Secondary | ICD-10-CM

## 2024-09-27 DIAGNOSIS — J988 Other specified respiratory disorders: Secondary | ICD-10-CM | POA: Diagnosis not present

## 2024-09-27 LAB — POC INFLUENZA A&B (BINAX/QUICKVUE)
Influenza A, POC: NEGATIVE
Influenza B, POC: NEGATIVE

## 2024-09-27 LAB — POC COVID19 BINAXNOW: SARS Coronavirus 2 Ag: NEGATIVE

## 2024-09-27 MED ORDER — DOXYCYCLINE HYCLATE 100 MG PO TABS: 100.0000 mg | ORAL_TABLET | Freq: Two times a day (BID) | ORAL | 0 refills | Status: AC

## 2024-09-27 MED ORDER — CEFDINIR 300 MG PO CAPS: 300.0000 mg | ORAL_CAPSULE | Freq: Two times a day (BID) | ORAL | 0 refills | Status: AC

## 2024-09-27 NOTE — Telephone Encounter (Signed)
 Attempted to call patient x 2.  Was unable to reach patient.  I did leave a voicemail but there was a lot of static on the phone, therefore not certain if she was able to understand the voicemail.  If she returns the call please give her this message for me. I did go ahead and call in the Omnicef  twice daily in addition to the doxycycline  just to be safe since I do see some abnormalities in the lower lung fields consistent with her exam.   Also, ordered a stat CT chest to be completed to further evaluate an opacity noted in the right upper portion of her lung.  We need you have the image done soon as possible rule out that there is nothing concerning developing in her right upper lung field, especially with her history of breast cancer.

## 2024-09-27 NOTE — Progress Notes (Signed)
 Carmen Shannon , 1970-12-03, 53 y.o., female MRN: [id number] Patient Care Team    Relationship Specialty Notifications Start End  Catherine Charlies LABOR, DO PCP - General Family Medicine  05/25/19   Nick Recardo Neptune, MD Referring Physician Hematology and Oncology  05/25/19   Girard Devere CROME, [place]  Physician Assistant  05/25/19   Draeger, Robynn ORN, MD Referring Physician Orthopedic Surgery  05/25/19   Royce Almarie HERO, MD Referring Physician Internal Medicine  05/24/22     Chief Complaint  Patient presents with   Cough    Nasal congestion. Since Tuesday. Pt has tried Dayquil/Nyquil.      Subjective: Carmen Shannon is a 53 y.o. Pt presents for an OV with complaints of cough and nasal congestion of 7 days duration.  Associated symptoms include (see ros). Pt has tried dayquil/nyquil to ease their symptoms.  Cough is dry.  Pt is of high risk for complications d/t illness     12/19/2023    8:42 AM 01/14/2023    1:22 PM 04/30/2022    8:27 AM 07/11/2021    2:10 PM 05/22/2021   11:42 AM  Depression screen PHQ 2/9  Decreased Interest 0 0 1 1 0  Down, Depressed, Hopeless 1 0 1 2 0  PHQ - 2 Score 1 0 2 3 0  Altered sleeping 1 0 1 2 0  Tired, decreased energy 0 0 1 1 0  Change in appetite 0 0 2 1 0  Feeling bad or failure about yourself  0 0 1 1 0  Trouble concentrating 0 0 1 0 0  Moving slowly or fidgety/restless 0 0 1 0 0  Suicidal thoughts 0 0 0 0 0  PHQ-9 Score 2  0  9  8  0   Difficult doing work/chores Not difficult at all         Data saved with a previous flowsheet row definition    Allergies  Allergen Reactions   Diphenhydramine Other (See Comments)   Oxycodone -Acetaminophen  Itching   Diphenhydramine Hcl Other (See Comments)    Paradoxical reaction.  Hyper   Social History   Social History Narrative   Marital status/children/pets: married. 2 living children (1 son (twin) deceased)   Education/employment: B.S. Scientist, Research (physical Sciences).       Past Medical History:  Diagnosis  Date   Breast cancer (HCC)    CHF (congestive heart failure) (HCC) 2017   From Chemo   Chicken pox    COVID-19 05/22/2021   Depression    Migraines    Pleural effusion 02/05/2017   Right thyroid  nodule 05/29/2016   Past Surgical History:  Procedure Laterality Date   BREAST BIOPSY  2017   BUNIONECTOMY     CESAREAN SECTION     1997, 2001   COLONOSCOPY  2019   MASTECTOMY Bilateral 04/2019   TONSILLECTOMY AND ADENOIDECTOMY     WISDOM TOOTH EXTRACTION     Family History  Problem Relation Age of Onset   Lung cancer Mother    Alzheimer's disease Maternal Grandmother    Heart attack Maternal Grandfather    Allergies as of 09/27/2024       Reactions   Diphenhydramine Other (See Comments)   Oxycodone -acetaminophen  Itching   Diphenhydramine Hcl Other (See Comments)   Paradoxical reaction.  Hyper        Medication List        Accurate as of September 27, 2024 10:53 AM. If you have any questions, ask  your nurse or doctor.          atorvastatin  20 MG tablet Commonly known as: LIPITOR Take 1 tablet (20 mg total) by mouth at bedtime.   buPROPion  300 MG 24 hr tablet Commonly known as: WELLBUTRIN  XL Take 1 tablet (300 mg total) by mouth daily.   carvedilol 25 MG tablet Commonly known as: COREG Take by mouth 2 (two) times daily.   doxycycline  100 MG tablet Commonly known as: VIBRA -TABS Take 1 tablet (100 mg total) by mouth 2 (two) times daily. Started by: Charlies Bellini   empagliflozin 10 MG Tabs tablet Commonly known as: JARDIANCE Take 1 tablet by mouth daily.   furosemide 20 MG tablet Commonly known as: LASIX Take by mouth as needed.   letrozole 2.5 MG tablet Commonly known as: FEMARA Take by mouth.   losartan 25 MG tablet Commonly known as: COZAAR Take 25 mg by mouth at bedtime.   SUMAtriptan  100 MG tablet Commonly known as: IMITREX  Take 1 tablet (100 mg total) by mouth daily as needed for migraine. May rpt dose one in 2 hrs if needed        All past  medical history, surgical history, allergies, family history, immunizations andmedications were updated in the EMR today and reviewed under the history and medication portions of their EMR.     Review of Systems  Constitutional:  Positive for malaise/fatigue. Negative for chills and fever.  HENT:  Positive for congestion, sinus pain and sore throat. Negative for ear pain.   Eyes: Negative.   Respiratory:  Positive for cough. Negative for sputum production.   Cardiovascular: Negative.   Gastrointestinal:  Negative for abdominal pain, diarrhea, nausea and vomiting.  Genitourinary: Negative.   Musculoskeletal:  Positive for myalgias.  Skin:  Negative for rash.  Neurological:  Positive for headaches. Negative for dizziness.   Negative, with the exception of above mentioned in HPI   Objective:  BP 108/70   Pulse (!) 102   Temp 98.2 F (36.8 C)   Wt 130 lb 6.4 oz (59.1 kg)   SpO2 96%   BMI 24.31 kg/m  Body mass index is 24.31 kg/m. Physical Exam Vitals and nursing note reviewed.  Constitutional:      General: She is not in acute distress.    Appearance: Normal appearance. She is not ill-appearing, toxic-appearing or diaphoretic.  HENT:     Head: Normocephalic and atraumatic.     Right Ear: Tympanic membrane, ear canal and external ear normal.     Left Ear: Tympanic membrane, ear canal and external ear normal.     Nose: Congestion and rhinorrhea present.     Comments: erythema    Mouth/Throat:     Mouth: Mucous membranes are moist.     Pharynx: No oropharyngeal exudate or posterior oropharyngeal erythema.     Comments: PND present Eyes:     General: No scleral icterus.       Right eye: No discharge.        Left eye: No discharge.     Extraocular Movements: Extraocular movements intact.     Conjunctiva/sclera: Conjunctivae normal.     Pupils: Pupils are equal, round, and reactive to light.  Cardiovascular:     Rate and Rhythm: Regular rhythm. Tachycardia present.   Pulmonary:     Effort: Pulmonary effort is normal. No respiratory distress.     Breath sounds: Rhonchi and rales present. No wheezing.  Musculoskeletal:     Cervical back: Neck supple. No tenderness.  Right lower leg: No edema.     Left lower leg: No edema.  Lymphadenopathy:     Cervical: No cervical adenopathy.  Skin:    General: Skin is warm.     Findings: No rash.  Neurological:     Mental Status: She is alert and oriented to person, place, and time. Mental status is at baseline.     Motor: No weakness.     Gait: Gait normal.  Psychiatric:        Mood and Affect: Mood normal.        Behavior: Behavior normal.        Thought Content: Thought content normal.        Judgment: Judgment normal.     No results found. No results found. Results for orders placed or performed in visit on 09/27/24 (from the past 24 hours)  POC COVID-19 BinaxNow     Status: Normal   Collection Time: 09/27/24 10:43 AM  Result Value Ref Range   SARS Coronavirus 2 Ag Negative Negative  POC Influenza A&B (Binax test)     Status: Normal   Collection Time: 09/27/24 10:43 AM  Result Value Ref Range   Influenza A, POC Negative Negative   Influenza B, POC Negative Negative    Assessment/Plan: Carmen Shannon is a 53 y.o. female present for OV for  Acute cough - POC COVID-19 BinaxNow>neg - POC Influenza A&B (Binax test)>neg   Tachycardia/fine rales/Bacterial respiratory infection (Primary) Immunocompromised pt Rest, hydrate.  Continue mucinex (DM if cough), nettie pot or nasal saline.  Doxy BID prescribed, take until completed.  - DG Chest 2 View; Future> if PNA result will add omnicef  Follow-up in 2 weeks as needed if symptoms unresolved, sooner if needed  Reviewed expectations re: course of current medical issues. Discussed self-management of symptoms. Outlined signs and symptoms indicating need for more acute intervention. Patient verbalized understanding and all questions were  answered. Patient received an After-Visit Summary.    Orders Placed This Encounter  Procedures   DG Chest 2 View   POC COVID-19 BinaxNow   POC Influenza A&B (Binax test)   Meds ordered this encounter  Medications   doxycycline  (VIBRA -TABS) 100 MG tablet    Sig: Take 1 tablet (100 mg total) by mouth 2 (two) times daily.    Dispense:  20 tablet    Refill:  0   Referral Orders  No referral(s) requested today     Note is dictated utilizing voice recognition software. Although note has been proof read prior to signing, occasional typographical errors still can be missed. If any questions arise, please do not hesitate to call for verification.   electronically signed by:  Charlies Bellini, DO  [facility] [place]

## 2024-09-28 ENCOUNTER — Ambulatory Visit: Payer: Self-pay | Admitting: Family Medicine

## 2024-09-28 NOTE — Telephone Encounter (Signed)
 Please call patient: Prior CT chest results favor either scarring, possible superimposed pneumonia. Recommendations are for her to take both of the antibiotics as prescribed yesterday. Follow-up in 2 weeks with provider for recheck, sooner if symptoms are not improving or worsening.  We will need to obtain a chest x-ray repeat in 2-4 weeks.  By CT reading, the abnormal opacity is unlikely to be tumor or malignancy.  Radiology department is also attempting to get the CT from [facility] healthcare from 2018 so they can directly compare this area.

## 2024-10-05 DIAGNOSIS — C50411 Malignant neoplasm of upper-outer quadrant of right female breast: Secondary | ICD-10-CM | POA: Diagnosis not present

## 2024-10-05 DIAGNOSIS — R0989 Other specified symptoms and signs involving the circulatory and respiratory systems: Secondary | ICD-10-CM | POA: Diagnosis not present

## 2024-10-05 DIAGNOSIS — Z5181 Encounter for therapeutic drug level monitoring: Secondary | ICD-10-CM | POA: Diagnosis not present

## 2024-10-05 DIAGNOSIS — I427 Cardiomyopathy due to drug and external agent: Secondary | ICD-10-CM | POA: Diagnosis not present

## 2024-10-07 ENCOUNTER — Encounter (HOSPITAL_BASED_OUTPATIENT_CLINIC_OR_DEPARTMENT_OTHER): Payer: Self-pay | Admitting: Hematology and Oncology

## 2024-10-18 ENCOUNTER — Other Ambulatory Visit (HOSPITAL_BASED_OUTPATIENT_CLINIC_OR_DEPARTMENT_OTHER): Payer: Self-pay | Admitting: Hematology and Oncology

## 2024-10-18 DIAGNOSIS — R0989 Other specified symptoms and signs involving the circulatory and respiratory systems: Secondary | ICD-10-CM

## 2024-10-25 ENCOUNTER — Ambulatory Visit (HOSPITAL_BASED_OUTPATIENT_CLINIC_OR_DEPARTMENT_OTHER)

## 2024-12-31 ENCOUNTER — Other Ambulatory Visit (HOSPITAL_BASED_OUTPATIENT_CLINIC_OR_DEPARTMENT_OTHER)
